# Patient Record
Sex: Female | Born: 1985
Health system: Southern US, Community
[De-identification: ages and names within clinical notes are randomized; demographics above are authoritative.]

## PROBLEM LIST (undated history)

## (undated) DIAGNOSIS — O24419 Gestational diabetes mellitus in pregnancy, unspecified control: Secondary | ICD-10-CM

## (undated) DIAGNOSIS — N390 Urinary tract infection, site not specified: Secondary | ICD-10-CM

## (undated) DIAGNOSIS — R519 Headache, unspecified: Secondary | ICD-10-CM

## (undated) DIAGNOSIS — Z789 Other specified health status: Secondary | ICD-10-CM

## (undated) HISTORY — DX: Other specified health status: Z78.9

## (undated) HISTORY — DX: Headache, unspecified: R51.9

---
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
## 2007-11-13 ENCOUNTER — Ambulatory Visit (HOSPITAL_COMMUNITY): Admission: RE | Admit: 2007-11-13 | Discharge: 2007-11-13 | Payer: Self-pay | Admitting: Obstetrics & Gynecology

## 2008-03-28 ENCOUNTER — Ambulatory Visit: Payer: Self-pay | Admitting: Obstetrics & Gynecology

## 2008-03-28 ENCOUNTER — Inpatient Hospital Stay (HOSPITAL_COMMUNITY): Admission: RE | Admit: 2008-03-28 | Discharge: 2008-03-30 | Payer: Self-pay | Admitting: Obstetrics & Gynecology

## 2010-07-02 LAB — CBC
Hemoglobin: 10.9 g/dL — ABNORMAL LOW (ref 12.0–15.0)
Hemoglobin: 12.5 g/dL (ref 12.0–15.0)
MCV: 90.7 fL (ref 78.0–100.0)
RBC: 3.57 MIL/uL — ABNORMAL LOW (ref 3.87–5.11)
RBC: 4.24 MIL/uL (ref 3.87–5.11)
RDW: 14.2 % (ref 11.5–15.5)
WBC: 8.5 10*3/uL (ref 4.0–10.5)
WBC: 8.8 10*3/uL (ref 4.0–10.5)

## 2010-07-31 NOTE — Op Note (Signed)
NAMEAPOORVA, BUGAY       ACCOUNT NO.:  [id number]   MEDICAL RECORD NO.:  [id number]          PATIENT TYPE:  INP   LOCATION:  9142                          FACILITY:  [facility]   PHYSICIAN:  Norton Blizzard, MD    DATE OF BIRTH:  1985-04-27   DATE OF PROCEDURE:  DATE OF DISCHARGE:                               OPERATIVE REPORT   PREOPERATIVE DIAGNOSES:  1. Intrauterine pregnancy at term.  2. History of previous cesarean section.   POSTOPERATIVE DIAGNOSES:  1. Intrauterine pregnancy at term.  2. History of previous cesarean section.   PROCEDURE:  Repeat low transverse cesarean section.   SURGEON:  Norton Blizzard, MD   ASSISTANT:  Odie Sera, DO   ANESTHESIA:  Spinal and local.   INDICATIONS FOR PROCEDURE:  Ms. Kinzlee Selvy is a gravida 2,  now para 2-0-0-2, who presents at 39-3/7th weeks for elective repeat low  transverse cesarean section.  She has a history of low transverse  cesarean section for failure to progress and was previously counseled on  vaginal birth after cesarean section versus elective cesarean, the  patient elected for an elective cesarean.  The risks and benefits of  this procedure to include but not limited to bleeding, infection, and  damage to internal organs, need for additional procedures including  hysterectomy in the event of a life-threatening hemorrhage were  discussed with the patient who voiced understanding and desired to  proceed with a cesarean section.   FINDINGS:  1. Clear amniotic fluid.  2. Viable female infant with Apgars 9 and 9.  3. Bilateral ovaries and oviducts grossly normal.   SPECIMENS:  Placenta.   DISPOSITION OF SPECIMEN:  Labor and Delivery.   ESTIMATED BLOOD LOSS:  600 mL.   COMPLICATIONS:  None immediate.   DESCRIPTION OF PROCEDURE:  The patient was taken to the operating room  where spinal anesthesia was introduced.  The patient was then prepped  and draped in the usual sterile manner.  A time-out  was conducted.  The  patient was placed in the left dorsal supine position.  A Pfannenstiel  incision was made in the skin with the scalpel.  The incision was  extended down to the fascial layers, and the fascia was incised in the  midline using the scalpel.  The fascial incision was extended  bilaterally using Mayo scissors in the usual manner.  The fascia was  then dissected off the underlying rectus muscles bluntly and sharply.  The rectus muscles were then separated in the midline, and the  peritoneum was opened bluntly with finger.  Peritoneal opening was  spread, and the peritoneal opening was extended with electrocautery.  The rectus muscles needed to be separated further with sharp dissection  with Mayo scissors in the inferior aspect of the rectus muscles.  Appropriate opening to the uterus was obtained, and a bladder blade was  placed.  A transverse incision was made in the lower uterine segment  with the scalpel.  The incision was carried carefully through the  myometrial layers.  The last myometrial layers were penetrated with a  hemostat.  Clear amniotic fluid was noted.  The  uterine opening was  extended bilaterally with manual traction.  The baby was noted to be in  a vertex occiput posterior presentation.  The head was grasped and  elevated out of the pelvis.  The bladder blade was removed.  The head  was delivered with the assistance of fundal pressure.  The mouth and  nares were then bulb suctioned.  The shoulders then followed by rest of  the corpus were delivered without problem.  The mouth and nares were  suctioned again, and the cord was clamped and cut, and the newborn was  transferred to the awaiting NICU staff.  The placenta was then removed  with the assistance of fundal massage.  Placenta was intact and had a  three-vessel cord.  Cord blood was collected.  The uterus was cleared of  clots and debris using a dry sponge.  The uterine incision was then  closed  using 0 Monocryl in the usual fashion, the first layer was closed  using a running interlocking suture.  A second layer closure was done  with the same 0 Vicryl suture in an imbricating fashion.  Good  hemostasis was noted.  The bilateral ovaries and oviducts were grossly  normal.  The rectus muscles were loosely approximated using 0 chromic.  The fascia was then closed using 0 PDS suture in a running  noninterlocking fashion.  Good hemostasis was noted.  There were no  defects noted in the fascia.  The subcutaneous tissues were irrigated  with a wet lap sponge.  Good hemostasis was noted.  The skin was then  closed with staples in the usual manner.  The superior and inferior  aspects of the incision were injected with 10 mL of 0.25% Marcaine each.  A pressure dressing was applied.  The patient was taken to the PACU in  good condition.      Odie Sera, DO  Electronically Signed     ______________________________  Norton Blizzard, MD    MC/MEDQ  D:  03/28/2008  T:  03/29/2008  Job:  [id number]

## 2010-07-31 NOTE — Discharge Summary (Signed)
NAMEIANNA, SALMELA       ACCOUNT NO.:  [id number]   MEDICAL RECORD NO.:  [id number]          PATIENT TYPE:  INP   LOCATION:  9142                          [facility]  [facility]   PHYSICIAN:  Norton Blizzard, MD    DATE OF BIRTH:  13-Apr-1985   DATE OF ADMISSION:  03/28/2008  DATE OF DISCHARGE:  03/30/2008                               DISCHARGE SUMMARY   BRIEF HOSPITAL COURSE:  Ms. Cristine Polio is a 25 year old G2, P2-0-0-  2 who presented to hospital at 21 and 3 weeks' gestation for scheduled  elective repeat C-section.  She was taken to the operating room, where a  low transverse caesarean section was performed by a Pfannenstiel  incision.  Her postoperative recovery course was normal without  complications.  Her hemoglobin was stable.  She is breastfeeding and  augmenting with some formula.  Her pain is well controlled.  She was  able to ambulate, eat, void, and have flatus prior to discharge.  She is  requesting pills for birth control.  She has been discharged on  progesterone only pills with instructions to address during her followup  visits for the possible transition to combined contraceptive pills.   LABORATORY DATA DURING HOSPITALIZATION:  Hemoglobin on admission 12.5,  on discharge 10.9.   DISCHARGE CONDITION:  Good.   DISPOSITION:  Home.  Follow up in 6 weeks at [facility].   DISCHARGE MEDICATIONS:  1. Prenatal vitamin.  2. Micronor 0.35 mg 1 by mouth every 24 hours.  3. Percocet 5/325 1-2 by mouth every 46 hours as needed for pain #30      Loel Dubonnet, MD      Norton Blizzard, MD  Electronically Signed    PN/MEDQ  D:  03/30/2008  T:  03/30/2008  Job:  [id number]   cc:   Norton Blizzard, MD   [facility] Department

## 2010-08-03 NOTE — Discharge Summary (Signed)
   NAME:  Natalie Snow, Natalie Snow                   ACCOUNT NO.:  [id number]   MEDICAL RECORD NO.:  [id number]                   PATIENT TYPE:  INP   LOCATION:  9147                                 [facility]   PHYSICIAN:  Tanya S. Shawnie Pons, M.D.                DATE OF BIRTH:  07/05/1985   DATE OF ADMISSION:  11/18/2002  DATE OF DISCHARGE:  11/22/2002                                 DISCHARGE SUMMARY   CONSULTATIONS:  None.   PROCEDURES:  Low transverse C-section.   DISCHARGE DIAGNOSES:  1. Intrauterine pregnancy at 41-3/[redacted] weeks gestation.  2. Postdates pregnancy.  3. Status post low transverse cesarean section secondary to failure to     progress and face presentation.  4. Face presentation.  5. Delivery of a viable female infant.  6. Polyhydramnios.   DISCHARGE MEDICATIONS:  1. Percocet 5/325 one to two p.o. q.4h. p.r.n. strong pain.  2. Ibuprofen 600 mg one p.o. q.6h. p.r.n. mild pain.  3. Iron 325 mg one p.o. b.i.d. for the next 6 weeks.  4. Prenatal vitamins one tablet p.o. once daily while breast-feeding.   FOLLOW UP:  The patient should follow up with [facility] in 6 weeks for  postpartum evaluation.   HOSPITAL COURSE:  [name] is a 25 year old G1, P0, presenting  at 41-3/7 weeks for induction of labor for postdates.  The patient was  admitted for cervical ripening as well as labor augmentation.  The patient  received amnioinfusion for light meconium.  The patient was appreciated to  have a face presentation, was also appreciated to have failure to progress.  The patient underwent low transverse C-section secondary to face  presentation as well as failure to progress.  Delivery of a viable female  infant with Apgars 9 at one minute and 9 at five minutes and infant weighed  8 pounds 2 ounces.  Again, presentation was vertex face presentation with  thick meconium-stained fluid.  Arterial pH of 7.29.  Please see operative  report for further details.  The  patient well in postoperative period,  remained afebrile and tolerating p.o. without difficulty.  The patient was  discharged on postoperative day #3 in stable condition.  The patient is  breast-feeding and will use condoms for contraception.   DISCHARGE INSTRUCTIONS:  Wound care: No heavy lifting for the next 6 weeks.  Diet: Regular.  Activity: No heavy lifting over 10 pounds.  Social activity:  Nothing per vagina for the next 6 weeks.   SPECIAL INSTRUCTIONS:  The patient should return to maternity admissions for  development of fever, severe abdominal pain, vomiting.    Nilda Simmer, M.D.                        Shelbie Proctor. Shawnie Pons, M.D.   KS/MEDQ  D:  11/22/2002  T:  11/22/2002  Job:  [id number]

## 2010-08-03 NOTE — Op Note (Signed)
NAME:  Natalie Snow, Natalie Snow                   ACCOUNT NO.:  [id number]   MEDICAL RECORD NO.:  [id number]                   PATIENT TYPE:  INP   LOCATION:  9147                                 [facility]   PHYSICIAN:  Elsie Lincoln                       DATE OF BIRTH:  [birth year]   DATE OF PROCEDURE:  11/19/2002  DATE OF DISCHARGE:                                 OPERATIVE REPORT   PREOPERATIVE DIAGNOSES:  1. A 25 year old para 0 female at 81 weeks' and 3 days estimated gestation.  2. Face presentation.  3. Failure to progress.   POSTOPERATIVE DIAGNOSES:  1. A 25 year old para 0 female at 55 weeks' and 3 days estimated gestation.  2. Face presentation.  3. Failure to progress.   PROCEDURE:  Primary low flap transverse cesarean section.   SURGEON:  Dr. Elsie Lincoln.   ASSISTANT:  Phil D. Okey Dupre, M.D.   ANESTHESIA:  Spinal.   ESTIMATED BLOOD LOSS:  800 mL.   COMPLICATIONS:  None.   PATHOLOGY:  None.   DRAINS:  Foley.   FINDINGS:  A viable female infant.  Apgars 9 one, 9 five, 8 pounds 2 ounces.  Vertex face presentation, thick meconium-stained fluid.  Arterial pH 8.29.  Normal tubes, ovaries, and uterus, and ____________ delivery.   PROCEDURE:  After informed consent obtained, the patient was taken to the  operating room where her spinal anesthesia was found to be adequate.  The  patient was placed in the dorsal supine position, prepped and draped in  normal sterile fashion, and a Foley placed in the bladder.  A Pfannenstiel  skin incision was made with a scalpel and carried down to the underlying  layer of fascia with the Bovie.  The Bovie was used to incise the fascia in  the midline.  This was extended bilaterally with the Mayo scissors.  The  superior and inferior aspects of the fascial incision were grasped with  Kocher clamps, tented up, and dissected off with the Bovie.  The peritoneum  was identified, tented up, and entered sharply with Metzenbaum  scissors.  This incision was extended superiorly and inferiorly with good visualization  of the bladder.  The bladder blade was inserted.  The vesicouterine  peritoneum was identified, tented up, and entered sharply with Metzenbaum  scissors.  This incision was extended bilaterally.  The bladder flap was  created digitally and the bladder blade was reinserted.  The uterine  incision was made in the lower uterine segment in transverse fashion with  the scalpel.  This incision was extended bilaterally with the bandage  scissors.  The baby's head delivered without incident.  The nose and mouth  were suctioned with the DeLee suction secondary to the meconium.  The rest  of the baby's body delivered without incident.  The cord was clamped and cut  and the baby was handed off to the awaiting pediatricians.  Cord  blood was  sent for type and screening.  __________ was none.  The placenta delivered  spontaneously with three-vessel cord.  The uterus was cleared of all clots  and debris and the uterine incision was closed with 0 Vicryl in a running-  locked fashion.  Excellent hemostasis was noted.  The gutters were cleared  of all clots and debris and the adnexa were noted to be grossly normal.  The  uterine incision was re-examined and noted to be hemostatic one last time.  The fascia was closed with 0 Vicryl in a running fashion.  Good hemostasis  was noted.  The subcutaneous tissue was copiously irrigated and noted to be  hemostatic.  The skin was closed with staples.  The patient tolerated the  procedure well.  Sponge, instrument, lap, and needle counts correct x2.  The  patient went to the recovery room with a Foley in her bladder and a sterile  dressing placed on top of the abdomen.                                               Elsie Lincoln    KL/MEDQ  D:  11/19/2002  T:  11/20/2002  Job:  [id number]

## 2012-08-23 ENCOUNTER — Encounter (HOSPITAL_COMMUNITY): Payer: Self-pay | Admitting: Emergency Medicine

## 2012-08-23 ENCOUNTER — Emergency Department (HOSPITAL_COMMUNITY)
Admission: EM | Admit: 2012-08-23 | Discharge: 2012-08-23 | Disposition: A | Discharge status: Home / Self Care | Payer: Self-pay | Attending: Emergency Medicine | Admitting: Emergency Medicine

## 2012-08-23 DIAGNOSIS — N390 Urinary tract infection, site not specified: Secondary | ICD-10-CM | POA: Insufficient documentation

## 2012-08-23 DIAGNOSIS — Z3202 Encounter for pregnancy test, result negative: Secondary | ICD-10-CM | POA: Insufficient documentation

## 2012-08-23 HISTORY — DX: Urinary tract infection, site not specified: N39.0

## 2012-08-23 LAB — URINALYSIS, ROUTINE W REFLEX MICROSCOPIC
Glucose, UA: NEGATIVE mg/dL
Ketones, ur: 15 mg/dL — AB
pH: 6 (ref 5.0–8.0)

## 2012-08-23 LAB — WET PREP, GENITAL
Clue Cells Wet Prep HPF POC: NONE SEEN
Yeast Wet Prep HPF POC: NONE SEEN

## 2012-08-23 LAB — URINE MICROSCOPIC-ADD ON

## 2012-08-23 MED ORDER — PHENAZOPYRIDINE HCL 200 MG PO TABS: 200.0000 mg | ORAL_TABLET | Freq: Three times a day (TID) | ORAL | Status: DC

## 2012-08-23 MED ORDER — CEPHALEXIN 500 MG PO CAPS: 500.0000 mg | ORAL_CAPSULE | Freq: Four times a day (QID) | ORAL | Status: DC

## 2012-08-23 MED ORDER — CEPHALEXIN 250 MG PO CAPS
500.0000 mg | ORAL_CAPSULE | Freq: Once | ORAL | Status: AC
  Administered 2012-08-23: 500 mg via ORAL
  Filled 2012-08-23: qty 2

## 2012-08-23 NOTE — ED Provider Notes (Signed)
Medical screening examination/treatment/procedure(s) were performed by non-physician practitioner and as supervising physician I was immediately available for consultation/collaboration.   Loreto Loescher, MD 08/23/12 1457 

## 2012-08-23 NOTE — ED Notes (Signed)
PA at bedside.

## 2012-08-23 NOTE — ED Notes (Signed)
Fever, lower back pain, bilateral foot pain, + dysuria. + abdominal pain for 1 week. Started on Bactrim for UTI on Friday.

## 2012-08-23 NOTE — ED Notes (Signed)
LMP: 07/25/12

## 2012-08-23 NOTE — ED Provider Notes (Signed)
History     CSN: [id number]  Arrival date & time 08/23/12  4098   First MD Initiated Contact with Patient 08/23/12 0932      No chief complaint on file.   (Consider location/radiation/quality/duration/timing/severity/associated sxs/prior treatment) HPI  27 year old female presents complaining of fever and dysuria. History was obtained through family member who was at bedside. patient reports for the past week she has had persistent burning urination, urinary frequency, and low abnormal pain. Pain is causing her to be nauseous and she vomited twice in total and also having some mild loose stools. For the past several days she has had a fever as high as 103. She was seen at a doctor's office 2 days ago and was diagnosed with having year tract infection. She was prescribed Bactrim and ibuprofen which he has been taking however her symptoms has not improved. She continues to endorse abdominal pain, and low back pain. Patient denies chest pain, shortness of breath, hematochezia, melena, vaginal discharge, or rash. Her last menstrual period was the 10th of last month. No prior history of STD.  No past medical history on file.  No past surgical history on file.  No family history on file.  History  Substance Use Topics  . Smoking status: Not on file  . Smokeless tobacco: Not on file  . Alcohol Use: Not on file    OB History   No data available      Review of Systems  All other systems reviewed and are negative.    Allergies  Review of patient's allergies indicates not on file.  Home Medications  No current outpatient prescriptions on file.  There were no vitals taken for this visit.  Physical Exam  Nursing note and vitals reviewed. Constitutional: She is oriented to person, place, and time. She appears well-developed and well-nourished. No distress.  HENT:  Head: Normocephalic and atraumatic.  Eyes: Conjunctivae are normal.  Neck: Normal range of motion. Neck supple.   Cardiovascular: Normal rate and regular rhythm.  Exam reveals no gallop and no friction rub.   No murmur heard. Pulmonary/Chest: Effort normal and breath sounds normal. No respiratory distress. She has no wheezes. She exhibits no tenderness.  Abdominal: Soft. Bowel sounds are normal. There is tenderness (Mild suprapubic tenderness on palpation without guarding or rebound tenderness.).  Genitourinary: Vagina normal and uterus normal. There is no rash or lesion on the right labia. There is no rash or lesion on the left labia. Cervix exhibits no motion tenderness and no discharge. Right adnexum displays no mass and no tenderness. Left adnexum displays no mass and no tenderness. No erythema, tenderness or bleeding around the vagina. No vaginal discharge found.  Chaperone present  No significant CVA tenderness. Tenderness to paralumbar region on percussion. No overlying skin changes.  Musculoskeletal: Normal range of motion.  Lymphadenopathy:       Right: No inguinal adenopathy present.       Left: No inguinal adenopathy present.  Neurological: She is alert and oriented to person, place, and time.    ED Course  Procedures (including critical care time)  9:57 AM Pt with c/o fever, dysuria and abd/low back pain.  Pt is febrile here however tachycardic.  Since pt able to tolerates PO, will give PO fluid instead of IVF.  Will perform pelvic exam.    11:16 AM Patient without CVA tenderness. She is afebrile here in ER. I do not suspect pyelonephritis at this time. Her UA is positive for urinary tract infection. Pelvic  exam was unremarkable. she is able to tolerates by mouth.  My plan is to send a urine culture, we'll give Keflex and to have patient continue with her Bactrim.  Labs Reviewed  WET PREP, GENITAL - Abnormal; Notable for the following:    WBC, Wet Prep HPF POC FEW (*)    All other components within normal limits  URINALYSIS, ROUTINE W REFLEX MICROSCOPIC - Abnormal; Notable for the  following:    APPearance CLOUDY (*)    Hgb urine dipstick SMALL (*)    Ketones, ur 15 (*)    Nitrite POSITIVE (*)    Leukocytes, UA MODERATE (*)    All other components within normal limits  URINE MICROSCOPIC-ADD ON - Abnormal; Notable for the following:    Squamous Epithelial / LPF FEW (*)    Bacteria, UA MANY (*)    All other components within normal limits  GC/CHLAMYDIA PROBE AMP  URINE CULTURE  POCT PREGNANCY, URINE   No results found.   1. UTI (lower urinary tract infection)       MDM  BP 110/70  Pulse 91  Temp(Src) 98.1 F (36.7 C) (Oral)  Resp 16  SpO2 99%         Fayrene Helper, PA-C 08/23/12 1144

## 2012-08-23 NOTE — ED Notes (Signed)
MD at bedside. 

## 2012-08-25 LAB — URINE CULTURE: Colony Count: >=100000

## 2012-08-26 NOTE — ED Notes (Signed)
Post ED Visit - Positive Culture Follow-up  Culture report reviewed by antimicrobial stewardship pharmacist: [x]  Wes Dulaney, Pharm.D., BCPS []  Celedonio Miyamoto, Pharm.D., BCPS []  Georgina Pillion, Pharm.D., BCPS []  [place], [place].D., BCPS, AAHIVP []  Estella Husk, Pharm.D., BCPS, AAHIVP  Positive urine  culture  no further patient follow-up is required at this time.  Larena Sox 08/26/2012, 4:18 PM

## 2014-08-29 ENCOUNTER — Other Ambulatory Visit (HOSPITAL_COMMUNITY)
Admission: RE | Admit: 2014-08-29 | Discharge: 2014-08-29 | Disposition: A | Discharge status: Home / Self Care | Payer: BLUE CROSS/BLUE SHIELD | Source: Ambulatory Visit | Attending: Gynecology | Admitting: Gynecology

## 2014-08-29 ENCOUNTER — Ambulatory Visit (INDEPENDENT_AMBULATORY_CARE_PROVIDER_SITE_OTHER): Payer: BLUE CROSS/BLUE SHIELD | Admitting: Gynecology

## 2014-08-29 ENCOUNTER — Encounter: Payer: Self-pay | Admitting: Gynecology

## 2014-08-29 VITALS — BP 110/70 | Ht 64.0 in | Wt 157.8 lb

## 2014-08-29 DIAGNOSIS — Z124 Encounter for screening for malignant neoplasm of cervix: Secondary | ICD-10-CM | POA: Diagnosis not present

## 2014-08-29 DIAGNOSIS — Z01419 Encounter for gynecological examination (general) (routine) without abnormal findings: Secondary | ICD-10-CM

## 2014-08-29 DIAGNOSIS — Z01411 Encounter for gynecological examination (general) (routine) with abnormal findings: Secondary | ICD-10-CM | POA: Insufficient documentation

## 2014-08-29 DIAGNOSIS — N915 Oligomenorrhea, unspecified: Secondary | ICD-10-CM | POA: Diagnosis not present

## 2014-08-29 DIAGNOSIS — Z1151 Encounter for screening for human papillomavirus (HPV): Secondary | ICD-10-CM | POA: Insufficient documentation

## 2014-08-29 NOTE — Patient Instructions (Signed)
Levonorgestrel intrauterine device (IUD) What is this medicine? LEVONORGESTREL IUD (LEE voe nor jes trel) is a contraceptive (birth control) device. The device is placed inside the uterus by a healthcare professional. It is used to prevent pregnancy and can also be used to treat heavy bleeding that occurs during your period. Depending on the device, it can be used for 3 to 5 years. This medicine may be used for other purposes; ask your health care provider or pharmacist if you have questions. COMMON BRAND NAME(S): LILETTA, Mirena, Skyla What should I tell my health care provider before I take this medicine? They need to know if you have any of these conditions: -abnormal Pap smear -cancer of the breast, uterus, or cervix -diabetes -endometritis -genital or pelvic infection now or in the past -have more than one sexual partner or your partner has more than one partner -heart disease -history of an ectopic or tubal pregnancy -immune system problems -IUD in place -liver disease or tumor -problems with blood clots or take blood-thinners -use intravenous drugs -uterus of unusual shape -vaginal bleeding that has not been explained -an unusual or allergic reaction to levonorgestrel, other hormones, silicone, or polyethylene, medicines, foods, dyes, or preservatives -pregnant or trying to get pregnant -breast-feeding How should I use this medicine? This device is placed inside the uterus by a health care professional. Talk to your pediatrician regarding the use of this medicine in children. Special care may be needed. Overdosage: If you think you have taken too much of this medicine contact a poison control center or emergency room at once. NOTE: This medicine is only for you. Do not share this medicine with others. What if I miss a dose? This does not apply. What may interact with this medicine? Do not take this medicine with any of the following  medications: -amprenavir -bosentan -fosamprenavir This medicine may also interact with the following medications: -aprepitant -barbiturate medicines for inducing sleep or treating seizures -bexarotene -griseofulvin -medicines to treat seizures like carbamazepine, ethotoin, felbamate, oxcarbazepine, phenytoin, topiramate -modafinil -pioglitazone -rifabutin -rifampin -rifapentine -some medicines to treat HIV infection like atazanavir, indinavir, lopinavir, nelfinavir, tipranavir, ritonavir -St. John's wort -warfarin This list may not describe all possible interactions. Give your health care provider a list of all the medicines, herbs, non-prescription drugs, or dietary supplements you use. Also tell them if you smoke, drink alcohol, or use illegal drugs. Some items may interact with your medicine. What should I watch for while using this medicine? Visit your doctor or health care professional for regular check ups. See your doctor if you or your partner has sexual contact with others, becomes HIV positive, or gets a sexual transmitted disease. This product does not protect you against HIV infection (AIDS) or other sexually transmitted diseases. You can check the placement of the IUD yourself by reaching up to the top of your vagina with clean fingers to feel the threads. Do not pull on the threads. It is a good habit to check placement after each menstrual period. Call your doctor right away if you feel more of the IUD than just the threads or if you cannot feel the threads at all. The IUD may come out by itself. You may become pregnant if the device comes out. If you notice that the IUD has come out use a backup birth control method like condoms and call your health care provider. Using tampons will not change the position of the IUD and are okay to use during your period. What side effects may   I notice from receiving this medicine? Side effects that you should report to your doctor or  health care professional as soon as possible: -allergic reactions like skin rash, itching or hives, swelling of the face, lips, or tongue -fever, flu-like symptoms -genital sores -high blood pressure -no menstrual period for 6 weeks during use -pain, swelling, warmth in the leg -pelvic pain or tenderness -severe or sudden headache -signs of pregnancy -stomach cramping -sudden shortness of breath -trouble with balance, talking, or walking -unusual vaginal bleeding, discharge -yellowing of the eyes or skin Side effects that usually do not require medical attention (report to your doctor or health care professional if they continue or are bothersome): -acne -breast pain -change in sex drive or performance -changes in weight -cramping, dizziness, or faintness while the device is being inserted -headache -irregular menstrual bleeding within first 3 to 6 months of use -nausea This list may not describe all possible side effects. Call your doctor for medical advice about side effects. You may report side effects to FDA at 1-800-FDA-1088. Where should I keep my medicine? This does not apply. NOTE: This sheet is a summary. It may not cover all possible information. If you have questions about this medicine, talk to your doctor, pharmacist, or health care provider.  2015, [employer]vier/Gold Standard. (2011-04-04 13:54:04)  

## 2014-08-29 NOTE — Progress Notes (Signed)
Natalie Snow [facility] 06/27/1985 [id number]   History:    29 y.o.  for annual gyn exam who was referred to our practice as a courtesy of her primary care physician Dr.Giovanni Llebre as a result of patient's history of dysfunction uterine bleeding. There was question at one time whether she had a cervical polyp or not. Patient does not want to go on any oral contraception pill because of weight gain. She is not using any form of contraception. But would like to discuss options. She stated her last Pap smear was [facility] several years ago. Her PCP has drawn all her lab work to include thyroid function test. Patient denies any nipple discharge she does suffer time for migraine headaches. Patient's had normal Pap smears in the past. Her 2 deliveries were via cesarean section several years ago.  Past medical history,surgical history, family history and social history were all reviewed and documented in the EPIC chart.  Gynecologic History Patient's last menstrual period was 08/03/2014. Contraception: none Last Pap: Approximately 3 years ago. Results were: Patient reports it was normal Last mammogram: Not indicated. Results were: normal  Obstetric History OB History  Gravida Para Term Preterm AB SAB TAB Ectopic Multiple Living  2 2        2     # Outcome Date GA Lbr Len/2nd Weight Sex Delivery Anes PTL Lv  2 Para           1 Para                ROS: A ROS was performed and pertinent positives and negatives are included in the history.  GENERAL: No fevers or chills. HEENT: No change in vision, no earache, sore throat or sinus congestion. NECK: No pain or stiffness. CARDIOVASCULAR: No chest pain or pressure. No palpitations. PULMONARY: No shortness of breath, cough or wheeze. GASTROINTESTINAL: No abdominal pain, nausea, vomiting or diarrhea, melena or bright red blood per rectum. GENITOURINARY: No urinary frequency, urgency, hesitancy or dysuria. MUSCULOSKELETAL: No joint or  muscle pain, no back pain, no recent trauma. DERMATOLOGIC: No rash, no itching, no lesions. ENDOCRINE: No polyuria, polydipsia, no heat or cold intolerance. No recent change in weight. HEMATOLOGICAL: No anemia or easy bruising or bleeding. NEUROLOGIC: No headache, seizures, numbness, tingling or weakness. PSYCHIATRIC: No depression, no loss of interest in normal activity or change in sleep pattern.     Exam: chaperone present  BP 110/70 mmHg  Ht 5\' 4"  (1.626 m)  Wt 157 lb 12.8 oz (71.578 kg)  BMI 27.07 kg/m2  LMP 08/03/2014  Body mass index is 27.07 kg/(m^2).  General appearance : Well developed well nourished female. No acute distress HEENT: Eyes: no retinal hemorrhage or exudates,  Neck supple, trachea midline, no carotid bruits, no thyroidmegaly Lungs: Clear to auscultation, no rhonchi or wheezes, or rib retractions  Heart: Regular rate and rhythm, no murmurs or gallops Breast:Examined in sitting and supine position were symmetrical in appearance, no palpable masses or tenderness,  no skin retraction, no nipple inversion, no nipple discharge, no skin discoloration, no axillary or supraclavicular lymphadenopathy Abdomen: no palpable masses or tenderness, no rebound or guarding Extremities: no edema or skin discoloration or tenderness  Pelvic:  Bartholin, Urethra, Skene Glands: Within normal limits             Vagina: No gross lesions or discharge  Cervix: No gross lesions or discharge  Uterus  anteverted, normal size, shape and consistency, non-tender and mobile  Adnexa  Without masses or tenderness  Anus and perineum  normal   Rectovaginal  normal sphincter tone without palpated masses or tenderness             Hemoccult not indicated     Assessment/Plan:  30 y.o. female for annual exam patient with heavy menstrual cycles lasting 7-10 days. Sometimes her cycles are every 28 day sometimes 35-40 days apart. Patient does not want to go on oral contraception pill or regulation of her  cycle but she does need some form of contraception. We discussed the Mirena IUD for which she is interested and in which literature information was provided. She will return back with her next cycle and we will insert the Mirena IUD. We are going to check a prolactin because of her irregular menstrual cycle. She denied any visual disturbances or any nipple discharge. Her Pap smear with HPV screening was done today.   Ok Edwards MD, 2:26 PM 08/29/2014

## 2014-08-29 NOTE — Addendum Note (Signed)
Addended by: Richardson Chiquito on: 08/29/2014 03:39 PM   Modules accepted: Orders

## 2014-08-30 ENCOUNTER — Telehealth: Payer: Self-pay | Admitting: Gynecology

## 2014-08-30 LAB — PROLACTIN: Prolactin: 6.6 ng/mL

## 2014-08-30 NOTE — Telephone Encounter (Signed)
08/30/14-I LM VM that her BC ins will cover the Mirena & insertion for contraception at 100% and that it needs to be inserted while on cycle. Pt to call and confirm if she wants to proceed.wl

## 2014-08-31 LAB — CYTOLOGY - PAP

## 2015-08-26 ENCOUNTER — Ambulatory Visit (INDEPENDENT_AMBULATORY_CARE_PROVIDER_SITE_OTHER): Payer: BLUE CROSS/BLUE SHIELD | Admitting: Urgent Care

## 2015-08-26 ENCOUNTER — Ambulatory Visit (INDEPENDENT_AMBULATORY_CARE_PROVIDER_SITE_OTHER): Payer: BLUE CROSS/BLUE SHIELD

## 2015-08-26 VITALS — BP 122/80 | HR 77 | Temp 97.9°F | Resp 18 | Ht 64.0 in | Wt 158.5 lb

## 2015-08-26 DIAGNOSIS — J029 Acute pharyngitis, unspecified: Secondary | ICD-10-CM

## 2015-08-26 DIAGNOSIS — R0981 Nasal congestion: Secondary | ICD-10-CM | POA: Diagnosis not present

## 2015-08-26 DIAGNOSIS — R51 Headache: Secondary | ICD-10-CM | POA: Diagnosis not present

## 2015-08-26 DIAGNOSIS — H9203 Otalgia, bilateral: Secondary | ICD-10-CM

## 2015-08-26 DIAGNOSIS — R05 Cough: Secondary | ICD-10-CM

## 2015-08-26 DIAGNOSIS — R059 Cough, unspecified: Secondary | ICD-10-CM

## 2015-08-26 DIAGNOSIS — R519 Headache, unspecified: Secondary | ICD-10-CM

## 2015-08-26 MED ORDER — HYDROCODONE-HOMATROPINE 5-1.5 MG/5ML PO SYRP: 5.0000 mL | ORAL_SOLUTION | Freq: Three times a day (TID) | ORAL | Status: DC | PRN

## 2015-08-26 MED ORDER — PSEUDOEPHEDRINE HCL ER 120 MG PO TB12: 120.0000 mg | ORAL_TABLET | Freq: Two times a day (BID) | ORAL | Status: DC

## 2015-08-26 MED ORDER — BENZONATATE 100 MG PO CAPS: 100.0000 mg | ORAL_CAPSULE | Freq: Three times a day (TID) | ORAL | Status: DC | PRN

## 2015-08-26 MED ORDER — CETIRIZINE HCL 10 MG PO TABS: 10.0000 mg | ORAL_TABLET | Freq: Every day | ORAL | Status: DC

## 2015-08-26 NOTE — Progress Notes (Signed)
    MRN: [id number] DOB: [birth year]  Subjective:   Natalie Snow is a 30 y.o. female presenting for chief complaint of Sore Throat; Cough; and Headache  Reports 3 day history sore throat, productive cough, nasal congestion, sinus headache, bilateral ear pain. Cough elicits shob. Her throat pain has improved. Has tried Mucinex and VaporRub, Ibuprofen with minimal relief. Admits history of seasonal allergies, not currently taking anything for this. Denies chest pain, wheezing, n/v, abdominal pain. Denies smoking cigarettes or drinking alcohol.   Natalie Snow currently has no medications in their medication list. Also has No Known Allergies.  Natalie Snow  has a past medical history of UTI (urinary tract infection). Also  has past surgical history that includes Cesarean section.  Her family history is positive for hypothyroidism in her mother and sister.  Objective:   Vitals: BP 122/80 mmHg  Pulse 77  Temp(Src) 97.9 F (36.6 C) (Oral)  Resp 18  Ht 5\' 4"  (1.626 m)  Wt 158 lb 8 oz (71.895 kg)  BMI 27.19 kg/m2  SpO2 99%  LMP 07/28/2015 (Exact Date)  Physical Exam  Constitutional: She is oriented to person, place, and time. She appears well-developed and well-nourished.  HENT:  TM's intact bilaterally, no effusions or erythema. Nasal turbinates pink and moist, nasal passages patent. Frontal and mild bilateral maxillary sinus tenderness. Mild post-nasal drainage present but no exudates or erythema, mucous membranes moist, dentition in good repair.  Eyes: Right eye exhibits no discharge. Left eye exhibits no discharge. No scleral icterus.  Neck: Normal range of motion. Neck supple.  Cardiovascular: Normal rate, regular rhythm and intact distal pulses.  Exam reveals no gallop and no friction rub.   No murmur heard. Pulmonary/Chest: No respiratory distress. She has no wheezes. She has no rales.  Lymphadenopathy:    She has no cervical adenopathy.  Neurological: She is alert and oriented to  person, place, and time.  Skin: Skin is warm and dry.   Dg Sinuses Complete  08/26/2015  CLINICAL DATA:  Sinus congestion and facial pain for 3 days. EXAM: PARANASAL SINUSES - COMPLETE 3 + VIEW COMPARISON:  None. FINDINGS: No fluid levels in the paranasal sinuses. Mastoid air cells are clear. No osseous destruction. IMPRESSION: No plain film evidence of sinus disease. Please note that plain films are of low sensitivity and the test of choice is mini sinus CT. Electronically Signed   By: Natalie GreavesKyle  Snow M.D.   On: 08/26/2015 11:32   Assessment and Plan :   1. Cough 2. Sore throat 3. Sinus congestion 4. Sinus headache 5. Ear pain, bilateral - Likely undergoing viral upper respiratory infection. Recommended supportive care. RTC in 1 week if symptoms persists.   Natalie BambergMario Claud Gowan, PA-C [facility] [phone] 08/26/2015 10:28 AM

## 2015-08-26 NOTE — Patient Instructions (Addendum)
Upper Respiratory Infection, Adult Most upper respiratory infections (URIs) are a viral infection of the air passages leading to the lungs. A URI affects the nose, throat, and upper air passages. The most common type of URI is nasopharyngitis and is typically referred to as "the common cold." URIs run their course and usually go away on their own. Most of the time, a URI does not require medical attention, but sometimes a bacterial infection in the upper airways can follow a viral infection. This is called a secondary infection. Sinus and middle ear infections are common types of secondary upper respiratory infections. Bacterial pneumonia can also complicate a URI. A URI can worsen asthma and chronic obstructive pulmonary disease (COPD). Sometimes, these complications can require emergency medical care and may be life threatening.  CAUSES Almost all URIs are caused by viruses. A virus is a type of germ and can spread from one person to another.  RISKS FACTORS You may be at risk for a URI if:   You smoke.   You have chronic heart or lung disease.  You have a weakened defense (immune) system.   You are very young or very old.   You have nasal allergies or asthma.  You work in crowded or poorly ventilated areas.  You work in health care facilities or schools. SIGNS AND SYMPTOMS  Symptoms typically develop 2-3 days after you come in contact with a cold virus. Most viral URIs last 7-10 days. However, viral URIs from the influenza virus (flu virus) can last 14-18 days and are typically more severe. Symptoms may include:   Runny or stuffy (congested) nose.   Sneezing.   Cough.   Sore throat.   Headache.   Fatigue.   Fever.   Loss of appetite.   Pain in your forehead, behind your eyes, and over your cheekbones (sinus pain).  Muscle aches.  DIAGNOSIS  Your health care provider may diagnose a URI by:  Physical exam.  Tests to check that your symptoms are not due to  another condition such as:  Strep throat.  Sinusitis.  Pneumonia.  Asthma. TREATMENT  A URI goes away on its own with time. It cannot be cured with medicines, but medicines may be prescribed or recommended to relieve symptoms. Medicines may help:  Reduce your fever.  Reduce your cough.  Relieve nasal congestion. HOME CARE INSTRUCTIONS   Take medicines only as directed by your health care provider.   Gargle warm saltwater or take cough drops to comfort your throat as directed by your health care provider.  Use a warm mist humidifier or inhale steam from a shower to increase air moisture. This may make it easier to breathe.  Drink enough fluid to keep your urine clear or pale yellow.   Eat soups and other clear broths and maintain good nutrition.   Rest as needed.   Return to work when your temperature has returned to normal or as your health care provider advises. You may need to stay home longer to avoid infecting others. You can also use a face mask and careful hand washing to prevent spread of the virus.  Increase the usage of your inhaler if you have asthma.   Do not use any tobacco products, including cigarettes, chewing tobacco, or electronic cigarettes. If you need help quitting, ask your health care provider. PREVENTION  The best way to protect yourself from getting a cold is to practice good hygiene.   Avoid oral or hand contact with people with cold   symptoms.   Wash your hands often if contact occurs.  There is no clear evidence that vitamin C, vitamin E, echinacea, or exercise reduces the chance of developing a cold. However, it is always recommended to get plenty of rest, exercise, and practice good nutrition.  SEEK MEDICAL CARE IF:   You are getting worse rather than better.   Your symptoms are not controlled by medicine.   You have chills.  You have worsening shortness of breath.  You have brown or red mucus.  You have yellow or brown nasal  discharge.  You have pain in your face, especially when you bend forward.  You have a fever.  You have swollen neck glands.  You have pain while swallowing.  You have white areas in the back of your throat. SEEK IMMEDIATE MEDICAL CARE IF:   You have severe or persistent:  Headache.  Ear pain.  Sinus pain.  Chest pain.  You have chronic lung disease and any of the following:  Wheezing.  Prolonged cough.  Coughing up blood.  A change in your usual mucus.  You have a stiff neck.  You have changes in your:  Vision.  Hearing.  Thinking.  Mood. MAKE SURE YOU:   Understand these instructions.  Will watch your condition.  Will get help right away if you are not doing well or get worse.   This information is not intended to replace advice given to you by your health care provider. Make sure you discuss any questions you have with your health care provider.   Document Released: 08/28/2000 Document Revised: 07/19/2014 Document Reviewed: 06/09/2013 Elsevier Interactive Patient Education 2016 Elsevier Inc.     IF you received an x-ray today, you will receive an invoice from [facility]. Please contact [facility] at [phone] with questions or concerns regarding your invoice.   IF you received labwork today, you will receive an invoice from [facility]. Please contact [facility] at [phone] with questions or concerns regarding your invoice.   Our billing staff will not be able to assist you with questions regarding bills from these companies.  You will be contacted with the lab results as soon as they are available. The fastest way to get your results is to activate your My Chart account. Instructions are located on the last page of this paperwork. If you have not heard from us regarding the results in 2 weeks, please contact this office.     

## 2015-08-30 ENCOUNTER — Encounter: Payer: Self-pay | Admitting: *Deleted

## 2015-11-07 ENCOUNTER — Encounter: Payer: Self-pay | Admitting: Obstetrics and Gynecology

## 2015-12-20 ENCOUNTER — Telehealth: Payer: Self-pay | Admitting: *Deleted

## 2015-12-20 ENCOUNTER — Encounter: Payer: BLUE CROSS/BLUE SHIELD | Admitting: Obstetrics and Gynecology

## 2015-12-20 ENCOUNTER — Encounter: Payer: Self-pay | Admitting: *Deleted

## 2015-12-20 NOTE — Telephone Encounter (Signed)
Natalie Snow missed a scheduled appointment for a colposcopy. I called and left her a message she missed a scheduled appointment. Please call back to reschedule. Will send letter.

## 2016-07-31 ENCOUNTER — Encounter: Payer: Self-pay | Admitting: Gynecology

## 2016-09-23 DIAGNOSIS — J069 Acute upper respiratory infection, unspecified: Secondary | ICD-10-CM | POA: Diagnosis not present

## 2016-11-25 DIAGNOSIS — R109 Unspecified abdominal pain: Secondary | ICD-10-CM | POA: Diagnosis not present

## 2016-11-25 DIAGNOSIS — R110 Nausea: Secondary | ICD-10-CM | POA: Diagnosis not present

## 2016-11-26 DIAGNOSIS — N859 Noninflammatory disorder of uterus, unspecified: Secondary | ICD-10-CM | POA: Diagnosis not present

## 2016-11-26 DIAGNOSIS — N200 Calculus of kidney: Secondary | ICD-10-CM | POA: Diagnosis not present

## 2016-12-31 DIAGNOSIS — Z23 Encounter for immunization: Secondary | ICD-10-CM | POA: Diagnosis not present

## 2017-02-14 DIAGNOSIS — Z30011 Encounter for initial prescription of contraceptive pills: Secondary | ICD-10-CM | POA: Diagnosis not present

## 2017-04-14 DIAGNOSIS — M545 Low back pain: Secondary | ICD-10-CM | POA: Diagnosis not present

## 2017-04-14 DIAGNOSIS — R109 Unspecified abdominal pain: Secondary | ICD-10-CM | POA: Diagnosis not present

## 2017-04-14 DIAGNOSIS — M79669 Pain in unspecified lower leg: Secondary | ICD-10-CM | POA: Diagnosis not present

## 2017-04-14 DIAGNOSIS — R102 Pelvic and perineal pain: Secondary | ICD-10-CM | POA: Diagnosis not present

## 2017-10-14 ENCOUNTER — Ambulatory Visit: Payer: BLUE CROSS/BLUE SHIELD | Admitting: Urgent Care

## 2017-10-14 ENCOUNTER — Encounter: Payer: Self-pay | Admitting: Urgent Care

## 2017-10-14 ENCOUNTER — Other Ambulatory Visit: Payer: Self-pay

## 2017-10-14 VITALS — BP 114/69 | HR 77 | Temp 98.3°F | Resp 16 | Ht 64.0 in | Wt 164.0 lb

## 2017-10-14 DIAGNOSIS — S39012A Strain of muscle, fascia and tendon of lower back, initial encounter: Secondary | ICD-10-CM | POA: Diagnosis not present

## 2017-10-14 DIAGNOSIS — S300XXA Contusion of lower back and pelvis, initial encounter: Secondary | ICD-10-CM

## 2017-10-14 DIAGNOSIS — M545 Low back pain: Secondary | ICD-10-CM

## 2017-10-14 LAB — POCT URINALYSIS DIP (MANUAL ENTRY)
BILIRUBIN UA: NEGATIVE
GLUCOSE UA: NEGATIVE mg/dL
Ketones, POC UA: NEGATIVE mg/dL
Leukocytes, UA: NEGATIVE
NITRITE UA: NEGATIVE
Protein Ur, POC: NEGATIVE mg/dL
SPEC GRAV UA: 1.02 (ref 1.010–1.025)
UROBILINOGEN UA: 0.2 U/dL
pH, UA: 7 (ref 5.0–8.0)

## 2017-10-14 MED ORDER — CYCLOBENZAPRINE HCL 5 MG PO TABS: 5.0000 mg | ORAL_TABLET | Freq: Two times a day (BID) | ORAL | 1 refills | Status: DC | PRN

## 2017-10-14 MED ORDER — MELOXICAM 7.5 MG PO TABS: 7.5000 mg | ORAL_TABLET | Freq: Every day | ORAL | 1 refills | Status: DC

## 2017-10-14 NOTE — Progress Notes (Signed)
    MRN: [id number] DOB: 06-28-1985  Subjective:   Natalie Snow is a 32 y.o. female presenting for 1 week history of persistent low back pain.  Reports that she is already been seen for this and had imaging done which was negative for arthritis but was prescribed medication for back strain and muscle spasms.  Today, she reports that she was at work and lifted something heavy in her back pain started again.  Denies fever, nausea, vomiting, hematuria, dysuria, weakness, numbness, tingling.  Natalie Snow is not currently taking medications.  Also has No Known Allergies.  Natalie Snow  has a past medical history of UTI (urinary tract infection). Also  has a past surgical history that includes Cesarean section.  Objective:   Vitals: BP 114/69   Pulse 77   Temp 98.3 F (36.8 C) (Oral)   Resp 16   Ht 5\' 4"  (1.626 m)   Wt 164 lb (74.4 kg)   LMP 10/14/2017   SpO2 98%   BMI 28.15 kg/m   Physical Exam  Constitutional: She is oriented to person, place, and time. She appears well-developed and well-nourished.  Cardiovascular: Normal rate.  Pulmonary/Chest: Effort normal.  Musculoskeletal:       Lumbar back: She exhibits decreased range of motion (full flexion and extension), tenderness (over area depicted) and spasm (over paraspinal muscles). She exhibits no bony tenderness, no swelling, no edema and no deformity.       Back:  Neurological: She is alert and oriented to person, place, and time.   Results for orders placed or performed in visit on 10/14/17 (from the past 24 hour(s))  POCT urinalysis dipstick     Status: Abnormal   Collection Time: 10/14/17  5:57 PM  Result Value Ref Range   Color, UA yellow yellow   Clarity, UA clear clear   Glucose, UA negative negative mg/dL   Bilirubin, UA negative negative   Ketones, POC UA negative negative mg/dL   Spec Grav, UA 4.0981.020 1.1911.010 - 1.025   Blood, UA small (A) negative   pH, UA 7.0 5.0 - 8.0   Protein Ur, POC negative negative mg/dL   Urobilinogen, UA 0.2 0.2 or 1.0 E.U./dL   Nitrite, UA Negative Negative   Leukocytes, UA Negative Negative   Assessment and Plan :   Low back strain, initial encounter  Low back pain, unspecified back pain laterality, unspecified chronicity, with sciatica presence unspecified - Plan: POCT urinalysis dipstick  Contusion of lower back, initial encounter  Will start conservative management with Flexeril and meloxicam.  Work note provided.  Counseled patient that we may need to use a steroid course of her symptoms persist.  Return to clinic precautions reviewed otherwise follow-up in 1 week.  Wallis BambergMario Zailynn Brandel, PA-C [facility] [phone] 10/14/2017  5:52 PM

## 2017-10-14 NOTE — Patient Instructions (Addendum)
Low Back Strain A strain is a stretch or tear in a muscle or the strong cords of tissue that attach muscle to bone (tendons). Strains of the lower back (lumbar spine) are a common cause of low back pain. A strain occurs when muscles or tendons are torn or are stretched beyond their limits. The muscles may become inflamed, resulting in pain and sudden muscle tightening (spasms). A strain can happen suddenly due to an injury (trauma), or it can develop gradually due to overuse. There are three types of strains:  Grade 1 is a mild strain involving a minor tear of the muscle fibers or tendons. This may cause some pain but no loss of muscle strength.  Grade 2 is a moderate strain involving a partial tear of the muscle fibers or tendons. This causes more severe pain and some loss of muscle strength.  Grade 3 is a severe strain involving a complete tear of the muscle or tendon. This causes severe pain and complete or nearly complete loss of muscle strength. What are the causes? This condition may be caused by:  Trauma, such as a fall or a hit to the body.  Twisting or overstretching the back. This may result from doing activities that require a lot of energy, such as lifting heavy objects. What increases the risk? The following factors may increase your risk of getting this condition:  Playing contact sports.  Participating in sports or activities that put excessive stress on the back and require a lot of bending and twisting, including:  Lifting weights or heavy objects.  Gymnastics.  Soccer.  Figure skating.  Snowboarding.  Being overweight or obese.  Having poor strength and flexibility. What are the signs or symptoms? Symptoms of this condition may include:  Sharp or dull pain in the lower back that does not go away. Pain may extend to the buttocks.  Stiffness.  Limited range of motion.  Inability to stand up straight due to stiffness or pain.  Muscle spasms. How is this  diagnosed?   This condition may be diagnosed based on:  Your symptoms.  Your medical history.  A physical exam.  Your health care provider may push on certain areas of your back to determine the source of your pain.  You may be asked to bend forward, backward, and side to side to assess the severity of your pain and your range of motion.  Imaging tests, such as:  X-rays.  MRI. How is this treated? Treatment for this condition may include:  Applying heat and cold to the affected area.  Medicines to help relieve pain and to relax your muscles (muscle relaxants).  NSAIDs to help reduce swelling and discomfort.  Physical therapy. When your symptoms improve, it is important to gradually return to your normal routine as soon as possible to reduce pain, avoid stiffness, and avoid loss of muscle strength. Generally, symptoms should improve within 6 weeks of treatment. However, recovery time varies. Follow these instructions at home: Managing pain, stiffness, and swelling  If directed, apply ice to the injured area during the first 24 hours after your injury.  Put ice in a plastic bag.  Place a towel between your skin and the bag.  Leave the ice on for 20 minutes, 2-3 times a day.  If directed, apply heat to the affected area as often as told by your health care provider. Use the heat source that your health care provider recommends, such as a moist heat pack or a heating pad.    or a heating pad. ? Place a towel between your skin and the heat source. ? Leave the heat on for 20-30 minutes. ? Remove the heat if your skin turns bright red. This is especially important if you are unable to feel pain, heat, or cold. You may have a greater risk of getting burned. Activity  Rest and return to your normal activities as told by your health care provider. Ask your health care provider what activities are safe for you.  Avoid activities that take a lot of effort (are strenuous) for as long as told  by your health care provider.  Do exercises as told by your health care provider. General instructions   Take over-the-counter and prescription medicines only as told by your health care provider.  If you have questions or concerns about safety while taking pain medicine, talk with your health care provider.  Do not drive or operate heavy machinery until you know how your pain medicine affects you.  Do not use any tobacco products, such as cigarettes, chewing tobacco, and e-cigarettes. Tobacco can delay bone healing. If you need help quitting, ask your health care provider.  Keep all follow-up visits as told by your health care provider. This is important. How is this prevented?  Warm up and stretch before being active.  Cool down and stretch after being active.  Give your body time to rest between periods of activity.  Avoid: ? Being physically inactive for long periods at a time. ? Exercising or playing sports when you are tired or in pain.  Use correct form when playing sports and lifting heavy objects.  Use good posture when sitting and standing.  Maintain a healthy weight.  Sleep on a mattress with medium firmness to support your back.  Make sure to use equipment that fits you, including shoes that fit well.  Be safe and responsible while being active to avoid falls.  Do at least 150 minutes of moderate-intensity exercise each week, such as brisk walking or water aerobics. Try a form of exercise that takes stress off your back, such as swimming or stationary cycling.  Maintain physical fitness, including: ? Strength. ? Flexibility. ? Cardiovascular fitness. ? Endurance. Contact a health care provider if:  Your back pain does not improve after 6 weeks of treatment.  Your symptoms get worse. Get help right away if:  Your back pain is severe.  You are unable to stand or walk.  You develop pain in your legs.  You develop weakness in your buttocks or  legs.  You have difficulty controlling when you urinate or when you have a bowel movement. This information is not intended to replace advice given to you by your health care provider. Make sure you discuss any questions you have with your health care provider. Document Released: 03/04/2005 Document Revised: 11/09/2015 Document Reviewed: 12/14/2014 Elsevier Interactive Patient Education  2017 Elsevier Inc.     IF you received an x-ray today, you will receive an invoice from [facility]. Please contact [facility] at [phone] with questions or concerns regarding your invoice.   IF you received labwork today, you will receive an invoice from [facility]. Please contact [facility] at [phone] with questions or concerns regarding your invoice.   Our billing staff will not be able to assist you with questions regarding bills from these companies.  You will be contacted with the lab results as soon as they are available. The fastest way to get your results is to activate your My   Chart account. Instructions are located on the last page of this paperwork. If you have not heard from [place] regarding the results in 2 weeks, please contact this office.

## 2017-10-16 ENCOUNTER — Encounter: Payer: Self-pay | Admitting: Urgent Care

## 2017-10-21 ENCOUNTER — Ambulatory Visit: Payer: BLUE CROSS/BLUE SHIELD | Admitting: Urgent Care

## 2017-10-25 ENCOUNTER — Ambulatory Visit: Payer: BLUE CROSS/BLUE SHIELD | Admitting: Urgent Care

## 2017-10-25 ENCOUNTER — Other Ambulatory Visit: Payer: Self-pay

## 2017-10-25 ENCOUNTER — Encounter: Payer: Self-pay | Admitting: Urgent Care

## 2017-10-25 VITALS — BP 124/75 | HR 76 | Temp 98.6°F | Resp 16 | Ht 64.0 in | Wt 162.4 lb

## 2017-10-25 DIAGNOSIS — S300XXD Contusion of lower back and pelvis, subsequent encounter: Secondary | ICD-10-CM | POA: Diagnosis not present

## 2017-10-25 DIAGNOSIS — S39012D Strain of muscle, fascia and tendon of lower back, subsequent encounter: Secondary | ICD-10-CM | POA: Diagnosis not present

## 2017-10-25 DIAGNOSIS — M545 Low back pain: Secondary | ICD-10-CM | POA: Diagnosis not present

## 2017-10-25 MED ORDER — METHOCARBAMOL 500 MG PO TABS: 500.0000 mg | ORAL_TABLET | Freq: Every evening | ORAL | 1 refills | Status: DC | PRN

## 2017-10-25 MED ORDER — PREDNISONE 20 MG PO TABS: ORAL_TABLET | ORAL | 0 refills | Status: DC

## 2017-10-25 NOTE — Patient Instructions (Addendum)
Ciática  Sciatica  La ciática es el dolor, entumecimiento, debilidad u hormigueo a lo largo del nervio ciático. El nervio ciático comienza en la parte inferior de la espalda y desciende por la parte posterior de cada pierna. Controla los músculos en la parte inferior de las piernas y en la parte posterior de las rodillas. También proporciona sensibilidad a la parte posterior de los muslos, la parte inferior de las piernas y la planta de los pies. La ciática es un síntoma de otra afección que ejerce presión o “pellizca” el nervio ciático.  Generalmente la ciática afecta solo a un lado del cuerpo. Suele desaparecer por sí sola o con tratamiento. En algunos casos, la ciática puede volver a aparecer (ser recurrente).  ¿Cuáles son las causas?  Esta afección causa presión sobre el nervio ciático o lo “pellizca”. Esto puede ser el resultado de:  · Un disco que sobresale demasiado (hernia de disco) entre los huesos de la columna vertebral (vértebras).  · Cambios relacionados con la edad en los discos de la columna vertebral (discopatía degenerativa).  · Un trastorno doloroso que afecta un músculo de las nalgas (síndrome piriforme).  · Un crecimiento óseo adicional (espolón óseo) cerca del nervio ciático.  · Una lesión o fractura de la pelvis.  · Embarazo.  · Tumor (poco frecuente).    ¿Qué incrementa el riesgo?  Los siguientes factores pueden hacer que usted sea más propenso a tener esta enfermedad:  · Practicar deportes en los que se ejerce presión sobre la columna vertebral o en los que la columna realiza mucho esfuerzo, como el fútbol americano o el levantamiento de pesas.  · Tener poca fuerza y flexibilidad.  · Antecedentes médicos de lesiones en la espalda.  · Antecedentes médicos de cirugía en la espalda.  · Estar sentado durante largos períodos de tiempo.  · Realizar actividades que requieren agacharse o levantar objetos en forma repetida.  · Obesidad.    ¿Cuáles son los signos o los síntomas?  Los síntomas pueden  ser leves o graves, y pueden incluir los siguientes:  · Cualquiera de los siguientes problemas en la parte inferior de la espalda, piernas, cadera o nalgas:  ? Hormigueo leve o dolor sordo.  ? Sensación de ardor.  ? Dolor agudo.  · Adormecimiento de la parte posterior de la pantorrilla o la planta del pie.  · Debilidad en las piernas.  · Dolor de espalda intenso que dificulta el movimiento.    Estos síntomas podrían empeorar al toser, estornudar o reírse, o cuando se está sentado o de pie durante períodos prolongados. El sobrepeso también puede empeorar los síntomas. En algunos casos, los síntomas regresan luego de un tiempo.  ¿Cómo se diagnostica?  Esta afección se puede diagnosticar en función de lo siguiente:  · Sus síntomas.  · Un examen físico. El médico podría indicarle que realice ciertos movimientos para controlar si estos desencadenan los síntomas.  · También pueden hacerle exámenes que incluyen lo siguiente:  ? Análisis de sangre.  ? Radiografías.  ? Resonancia magnética (RM).  ? Exploración por tomografía computarizada (TC).    ¿Cómo se trata?  En muchos casos, esta afección mejora por sí sola, sin ningún tratamiento. Sin embargo, el tratamiento puede incluir:  · Reducción o modificación de la actividad física en los períodos de dolor.  · Ejercicios y estiramiento para fortalecer el abdomen y mejorar la flexibilidad de la columna vertebral.  · Aplicación de calor o hielo en la zona afectada.  · Medicamentos para lo siguiente:  ?   solamente como se lo haya indicado el mdico.  No conduzca ni opere maquinaria pesada mientras toma analgsicos recetados. Control del dolor  Si se lo indican,  aplique hielo en la zona afectada. ? Ponga el hielo en una bolsa plstica. ? Coloque una FirstEnergy Corptoalla entre la piel y la bolsa de hielo. ? Coloque el hielo durante 20minutos, 2 a 3veces por da.  Despus del hielo, aplique calor sobre la zona afectada antes de Education officer, environmentalrealizar ejercicio o con la frecuencia que le haya indicado el mdico. Use la fuente de calor que el mdico le recomiende, como una compresa de calor hmedo o una almohadilla trmica. ? Coloque una FirstEnergy Corptoalla entre la piel y la fuente de Airline pilotcalor. ? Aplique el calor durante 20 a 30minutos. ? Retire la fuente de calor si la piel se pone de color rojo brillante. Esto es muy importante si no puede sentir el dolor, el calor o el fro. Puede correr un riesgo mayor de sufrir quemaduras. Actividad  Retome sus actividades normales como se lo haya indicado el mdico. Pregntele al mdico qu actividades son seguras para usted. ? Evite las Liberty Mutualactividades que empeoran los sntomas.  Durante el da, descanse durante lapsos breves. Descansar recostado o de pie suele ser mejor que hacerlo sentado. ? Cuando descanse durante perodos ms largos, incorpore alguna [place]actividad suave o ejercicios de Bank of Americaelongacin entre los perodos de descanso. Esto ayudar a Transport plannerevitar la rigidez y Chief Technology Officerel dolor. ? Evite estar sentado durante largos perodos de tiempo sin moverse. Levntese y [place]muvase al menos una vez cada hora.  Haga ejercicio y elongue habitualmente, como se lo haya indicado el mdico.  No levante nada que pese ms de 10libras (4,5kg) mientras tenga sntomas de citica. Aunque no tenga sntomas, evite levantar objetos pesados, en especial en forma repetida.  Siempre use las tcnicas de levantamiento correctas para levantar objetos, entre ellas: ? Flexionar las rodillas. ? Mantener la carga cerca del cuerpo. ? No torcerse. Instrucciones generales  Mantenga una buena postura. ? Evite reclinarse hacia adelante cuando est sentado. ? Evite encorvar la espalda mientras est de  pie.  Mantenga un peso saludable. El exceso de peso ejerce presin adicional sobre la espalda y hace que resulte difcil mantener una buena [place]annettpostura.  Use calzado con buen apoyo y cmodo. Evite usar tacones.  Evite dormir sobre un colchn que sea demasiado blando o demasiado duro. Un colchn que ofrezca un apoyo suficientemente firme para su espalda al dormir puede ayudar a Engineer, materialsaliviar el dolor.  Concurra a todas las visitas de [address]seguimiento como se lo haya indicado el mdico. Esto es importante. Comunquese con un mdico si:  El dolor lo despierta cuando est dormido.  El dolor empeora cuando se [place]amacuesta.  El dolor es peor del que experiment en el pasado.  Los sntomas duran ms de 4 semanas.  Pierde peso en forma inexplicable. Solicite ayuda de inmediato si:  Pierde el control de la vejiga o del intestino (incontinencia).  Tiene los siguientes sntomas: ? Debilidad que empeora en la parte inferior de la espalda, la pelvis, las nalgas o las piernas. ? Enrojecimiento o inflamacin en la espalda. ? Sensacin de ardor al ConocoPhillipsorinar. Esta informacin no tiene Theme park managercomo fin reemplazar el consejo del mdico. Asegrese de hacerle al mdico cualquier pregunta que tenga. Document Released: 03/04/2005 Document Revised: 07/16/2016 Document Reviewed: 11/11/2014 Elsevier Interactive Patient Education  2018 [employer]     IF you received an x-ray today, you will receive an invoice from [facility]. Please contact [facility] at  [phone] with questions or concerns regarding your invoice.   IF you received labwork today, you will receive an invoice from [facility]. Please contact [facility] at [phone] with questions or concerns regarding your invoice.   Our billing staff will not be able to assist you with questions regarding bills from these companies.  You will be contacted with the lab results as soon as they are available. The fastest way to get your results is to activate  your My Chart account. Instructions are located on the last page of this paperwork. If you have not heard from [place] regarding the results in 2 weeks, please contact this office.

## 2017-10-25 NOTE — Progress Notes (Signed)
    MRN: [id number] DOB: September 16, 1985  Subjective:   Natalie Snow is a 32 y.o. female presenting for follow up on low back pain. She reports that it is better. However, she continues to have pain that typically presents at night. Throughout the day it is not as bothersome.  Patient was placed on work restrictions and did well with these. She reports that she has never smoked. She has never used smokeless tobacco. She reports that she does not drink alcohol.   Natalie Snow has a current medication list which includes the following prescription(s): cyclobenzaprine and meloxicam. Also has No Known Allergies.  Natalie Snow  has a past medical history of UTI (urinary tract infection). Also  has a past surgical history that includes Cesarean section.  Objective:   Vitals: BP 124/75   Pulse 76   Temp 98.6 F (37 C)   Resp 16   Ht 5\' 4"  (1.626 m)   Wt 162 lb 6.4 oz (73.7 kg)   LMP 10/14/2017   SpO2 100%   BMI 27.88 kg/m   Physical Exam  Constitutional: She is oriented to person, place, and time. She appears well-developed and well-nourished.  Cardiovascular: Normal rate.  Pulmonary/Chest: Effort normal.  Musculoskeletal:       Lumbar back: She exhibits tenderness (over area outlined but negative SLR). She exhibits normal range of motion, no bony tenderness, no swelling, no edema and no spasm.       Back:  Neurological: She is alert and oriented to person, place, and time. She displays normal reflexes. Coordination normal.   Assessment and Plan :   Low back strain, subsequent encounter  Low back pain, unspecified back pain laterality, unspecified chronicity, with sciatica presence unspecified  Contusion of lower back, subsequent encounter  Patient has improved but still having issues.  She is no longer having radicular pain.  However, due to persistent low back pain she would like to try something little stronger like prednisone.  We will also switch her from Flexeril to Robaxin to address  the drowsiness she experiences.  She has followed work restrictions well.  She is ready to lift some of those in terms of the weight that she can lift.  We will follow-up with another provider since I will be on PAL.  Recheck in 2 weeks.  Wallis BambergMario Siera [place]yers[place], PA-C [facility] [phone] 10/25/2017 2:27 PM

## 2018-01-06 DIAGNOSIS — Z23 Encounter for immunization: Secondary | ICD-10-CM | POA: Diagnosis not present

## 2018-09-01 ENCOUNTER — Encounter (HOSPITAL_COMMUNITY): Payer: Self-pay | Admitting: *Deleted

## 2018-09-01 ENCOUNTER — Other Ambulatory Visit: Payer: Self-pay

## 2018-09-01 ENCOUNTER — Inpatient Hospital Stay (HOSPITAL_COMMUNITY)
Admission: AD | Admit: 2018-09-01 | Discharge: 2018-09-01 | Disposition: A | Discharge status: Home / Self Care | Payer: BC Managed Care – PPO | Attending: Obstetrics and Gynecology | Admitting: Obstetrics and Gynecology

## 2018-09-01 ENCOUNTER — Inpatient Hospital Stay (HOSPITAL_COMMUNITY): Payer: BC Managed Care – PPO

## 2018-09-01 DIAGNOSIS — O360910 Maternal care for other rhesus isoimmunization, first trimester, not applicable or unspecified: Secondary | ICD-10-CM | POA: Diagnosis not present

## 2018-09-01 DIAGNOSIS — O209 Hemorrhage in early pregnancy, unspecified: Secondary | ICD-10-CM

## 2018-09-01 DIAGNOSIS — Z3A13 13 weeks gestation of pregnancy: Secondary | ICD-10-CM | POA: Diagnosis not present

## 2018-09-01 DIAGNOSIS — O26851 Spotting complicating pregnancy, first trimester: Secondary | ICD-10-CM | POA: Diagnosis not present

## 2018-09-01 DIAGNOSIS — O26859 Spotting complicating pregnancy, unspecified trimester: Secondary | ICD-10-CM | POA: Diagnosis not present

## 2018-09-01 DIAGNOSIS — Z6710 Type A blood, Rh positive: Secondary | ICD-10-CM

## 2018-09-01 DIAGNOSIS — Z3A01 Less than 8 weeks gestation of pregnancy: Secondary | ICD-10-CM | POA: Diagnosis not present

## 2018-09-01 DIAGNOSIS — Z3490 Encounter for supervision of normal pregnancy, unspecified, unspecified trimester: Secondary | ICD-10-CM

## 2018-09-01 LAB — CBC
HCT: 41.7 % (ref 36.0–46.0)
Hemoglobin: 13.6 g/dL (ref 12.0–15.0)
MCH: 29 pg (ref 26.0–34.0)
MCHC: 32.6 g/dL (ref 30.0–36.0)
MCV: 88.9 fL (ref 80.0–100.0)
Platelets: 217 10*3/uL (ref 150–400)
RBC: 4.69 MIL/uL (ref 3.87–5.11)
RDW: 13 % (ref 11.5–15.5)
WBC: 5.2 10*3/uL (ref 4.0–10.5)
nRBC: 0 % (ref 0.0–0.2)

## 2018-09-01 LAB — POCT PREGNANCY, URINE: Preg Test, Ur: POSITIVE — AB

## 2018-09-01 LAB — WET PREP, GENITAL
Clue Cells Wet Prep HPF POC: NONE SEEN
Sperm: NONE SEEN
Trich, Wet Prep: NONE SEEN
Yeast Wet Prep HPF POC: NONE SEEN

## 2018-09-01 LAB — HCG, QUANTITATIVE, PREGNANCY: hCG, Beta Chain, Quant, S: 18448 m[IU]/mL — ABNORMAL HIGH (ref <5)

## 2018-09-01 NOTE — Discharge Instructions (Signed)
Primer trimestre de embarazo  First Trimester of Pregnancy  El primer trimestre de embarazo se extiende desde la semana1 hasta el final de la semana13 (mes1 al mes3). Una semana despus de que un espermatozoide fecunda un vulo, este se implantar en la pared uterina. Este embrin comenzar a desarrollarse hasta convertirse en un beb. Sus genes y los de su pareja forman el beb. Los genes del varn determinan si ser un nio o una nia. Entre la semana6 y la8, se forman los ojos y el rostro, y los latidos del corazn pueden verse en una ecografa. Al final de las 12semanas, todos los rganos del beb estn formados.  Ahora que est embarazada, querr hacer todo lo que est a su alcance para tener un beb sano. Dos de las cosas ms importantes son tener un buen cuidado prenatal y seguir las indicaciones del mdico. El cuidado prenatal incluye toda la asistencia mdica que usted recibe antes del nacimiento del beb. Esto ayudar a prevenir, detectar y tratar cualquier problema durante el embarazo y el parto.  Durante el primer trimestre, ocurren cambios en el cuerpo.  Su organismo atraviesa por muchos cambios durante el embarazo. Estos cambios varan de una mujer a otra.   Al principio, puede aumentar o bajar algunos kilos.   Puede tener malestar estomacal (nuseas) y vomitar. Si no puede controlar los vmitos, llame al mdico.   Puede cansarse con facilidad.   Es posible que tenga dolores de cabeza que pueden aliviarse con ciertos medicamentos. Todos los medicamentos que tome deben estar aprobados por el mdico.   Puede orinar con mayor frecuencia. El dolor al orinar puede significar que usted tiene una infeccin de la vejiga.   Debido al embarazo, puede tener acidez estomacal.   Puede estar estreida, ya que ciertas hormonas enlentecen los movimientos de los msculos que empujan las heces a travs del intestino.   Pueden aparecer hemorroides o hincharse las venas (venas varicosas).   Las mamas  pueden empezar a agrandarse y estar sensibles. Los pezones pueden sobresalir ms, y el tejido que los rodea (arola) tornarse ms oscuro.   Las encas pueden sangrar y estar sensibles al cepillado y al hilo dental.   Pueden aparecer zonas oscuras o manchas (cloasma, mscara del embarazo) en el rostro. Esto probablemente se atenuar despus del nacimiento del beb.   Los perodos menstruales se interrumpirn.   Tal vez no tenga apetito.   Puede sentir un fuerte deseo de consumir ciertos alimentos.   Puede tener cambios a nivel emocional da a da, por ejemplo, por momentos puede estar emocionada por el embarazo y por otros preocuparse porque algo pueda salir mal con el embarazo o el beb.   Tendr sueos ms vvidos y extraos.   Tal vez haya cambios en el cabello. Esto cambios pueden incluir su engrosamiento, crecimiento rpido y cambios en la textura. Adems, a algunas mujeres se les cae el cabello durante o despus del embarazo, o tienen el cabello seco o fino. Lo ms probable es que el cabello se le normalice despus del nacimiento del beb.  Qu debe esperar en las visitas prenatales  Durante una visita prenatal de rutina:   La pesarn para asegurarse de que usted y el beb estn creciendo normalmente.   Le tomarn la presin arterial.   Le medirn el abdomen para controlar el desarrollo del beb.   Se escucharn los latidos cardacos fetales entre las semanas10 y14 de embarazo.   Se analizarn los resultados de los estudios solicitados en visitas   anteriores.  El mdico puede preguntarle lo siguiente:   Cmo se siente.   Si siente los movimientos del beb.   Si ha tenido sntomas anormales, como prdida de lquido, sangrado, dolores de cabeza intensos o clicos abdominales.   Si est consumiendo algn producto que contenga tabaco, como cigarrillos, tabaco de mascar y cigarrillos electrnicos.   Si tiene alguna pregunta.  Otros estudios que pueden realizarse durante el primer trimestre  incluyen lo siguiente:   Anlisis de sangre para determinar el grupo sanguneo y detectar la presencia de infecciones previas. Las pruebas tambin se utilizarn para determinar si tiene bajo nivel de hierro (anemia) y protenas en los glbulos rojos (anticuerpos Rh). En funcin de sus factores de riesgo, o si ya tuvo diabetes durante un embarazo anterior, le pueden hacer pruebas para determinar si tiene un nivel alto de azcar en la sangre, algo que puede afectar a embarazadas (diabetes gestacional).   Anlisis de orina para detectar infecciones, diabetes o protenas en la orina.   Una ecografa para confirmar que el beb crece y se desarrolla correctamente.   Estudios fetales para detectar problemas de la mdula espinal (espina bfida) y sndrome de Down.   Prueba del VIH (virus de inmunodeficiencia humana). Los exmenes prenatales de rutina incluyen la prueba de deteccin del VIH, a menos que decida no realizrsela.   Es posible que necesite otras pruebas adicionales.  Siga estas instrucciones en su casa:  Medicamentos   Siga las indicaciones del mdico en relacin con el uso de medicamentos. Durante el embarazo, hay medicamentos que pueden tomarse y otros que no.   Tome vitaminas prenatales que contengan por lo menos 600microgramos (?g) de cido flico.   Si est estreida, tome un laxante suave, si el mdico lo autoriza.  Comida y bebida     Lleve una dieta equilibrada que incluya gran cantidad de frutas y verduras frescas, cereales integrales, buenas fuentes de protenas como carnes magras, huevos o tofu, y lcteos descremados. El mdico la ayudar a determinar la cantidad de peso que puede aumentar.   No coma carne cruda ni quesos sin cocinar. Estos elementos contienen grmenes que pueden causar defectos congnitos en el beb.   La ingesta diaria de cuatro o cinco comidas pequeas en lugar de tres comidas abundantes puede ayudar a aliviar las nuseas y los vmitos. Si empieza a tener nuseas,  comer algunas galletas saladas puede ser de ayuda. Beber lquidos entre las comidas, en lugar de tomarlos durante las comidas, tambin puede ayudar a aliviar las nuseas y los vmitos.   Limite el consumo de alimentos con alto contenido de grasas y azcares procesados, como alimentos fritos o dulces.   Para evitar el estreimiento:  ? Consuma alimentos ricos en fibra, como frutas y verduras frescas, cereales integrales y frijoles.  ? Beba suficiente lquido para mantener la orina clara o de color amarillo plido.  Actividad   Haga ejercicio solamente como se lo haya indicado el mdico. La mayora de las mujeres pueden continuar su rutina de ejercicios durante el embarazo. Intente realizar como mnimo 30minutos de actividad fsica por lo menos 5das a la semana. El ejercicio la ayudar a:  ? Controlar su peso.  ? Mantenerse en forma.  ? Estar preparada para el trabajo de parto y el parto.   Los dolores, los clicos en la parte baja del abdomen o los calambres en la cintura son un buen indicio de que debe dejar de hacer ejercicios. Consulte al mdico antes de seguir haciendo ejercicios con   normalidad.   Intente no estar de pie durante mucho tiempo. Mueva las piernas con frecuencia si debe estar de pie en un lugar durante mucho tiempo.   Evite levantar pesos excesivos.   Use zapatos de tacones bajos y mantenga una buena postura.   Puede seguir teniendo relaciones sexuales, salvo que el mdico le indique lo contrario.  Alivio del dolor y del malestar   Use un sostn que le brinde buen soporte para aliviar el dolor de mamas.   Dese baos de asiento con agua tibia para aliviar el dolor o las molestias causadas por las hemorroides. Use una crema para las hemorroides si el mdico la autoriza.   Descanse con las piernas elevadas si tiene calambres o dolor de cintura.   Si tiene venas varicosas en las piernas, use medias de descanso. Eleve los pies durante 15minutos, 3 o 4veces por da. Limite el consumo de  sal en su dieta.  Cuidados prenatales   Programe las visitas prenatales para la semana12 de embarazo. Generalmente se programan cada mes al principio y se hacen ms frecuentes en los 2 ltimos meses antes del parto.   Escriba sus preguntas. Llvelas cuando concurra a las visitas prenatales.   Concurra a todas las visitas prenatales tal como se lo haya indicado el mdico. Esto es importante.  Seguridad   Use el cinturn de seguridad en todo momento mientras conduce.   Haga una lista de los nmeros de telfono de emergencia, que incluya los nmeros de telfono de familiares, amigos, el hospital y los departamentos de polica y bomberos.  Instrucciones generales   Pdale al mdico que la derive a clases de educacin prenatal en su localidad. Debe comenzar a tomar las clases antes de que empiece el mes6 de embarazo.   Pida ayuda si tiene necesidades nutricionales o de asesoramiento durante el embarazo. El mdico puede aconsejarla o derivarla a especialistas para que la ayuden con diferentes necesidades.   No se d baos de inmersin en agua caliente, baos turcos ni saunas.   No se haga duchas vaginales ni use tampones o toallas higinicas perfumadas.   No mantenga las piernas cruzadas durante mucho tiempo.   Evite el contacto con las bandejas sanitarias de los gatos y la tierra que estos animales usan. Estos elementos contienen bacterias que pueden causar defectos congnitos al beb y la posible prdida del feto debido a un aborto espontneo o muerte fetal.   No fume, no consuma hierbas ni medicamentos que no hayan sido recetados por el mdico. Las sustancias qumicas que estos productos contienen afectan la formacin y el desarrollo del beb.   No consuma ningn producto que contenga nicotina o tabaco, como cigarrillos y cigarrillos electrnicos. Si necesita ayuda para dejar de fumar, consulte al mdico. Puede recibir asesoramiento y otro tipo de recursos para dejar de fumar.   Programe una cita con el  dentista. En su casa, lvese los dientes con un cepillo dental blando y psese el hilo dental con suavidad.  Comunquese con un mdico si:   Tiene mareos.   Siente clicos leves, presin en la pelvis o dolor persistente en el abdomen.   Tiene nuseas, vmitos o diarrea persistentes.   Observa una secrecin vaginal con mal olor.   Siente dolor al orinar.   Observa ms hinchazn en la cara, las manos, las piernas o los tobillos.   Est expuesta a la quinta enfermedad o a la varicela.   Est expuesta al sarampin alemn (rubola) y nunca lo haba tenido.    Solicite ayuda de inmediato si:   Tiene fiebre.   Tiene una prdida de lquido por la vagina.   Tiene sangrado o pequeas prdidas vaginales.   Siente dolor intenso o clicos en el abdomen.   Sube o baja de peso rpidamente.   Vomita sangre de color rojo brillante o una sustancia similar a los granos de caf.   Dolor de cabeza intenso.   Le falta el aire.   Sufre cualquier tipo de traumatismo, por ejemplo, debido a una cada o un accidente automovilstico.  Resumen   El primer trimestre de embarazo se extiende desde la semana1 hasta el final de la semana13 (mes1 al mes3).   Su organismo atraviesa por muchos cambios durante el embarazo. Estos cambios varan de una mujer a otra.   Tendr visitas prenatales de rutina. Durante esas visitas, el mdico la examinar, hablar con usted acerca de los resultados de sus pruebas y le preguntar cmo se siente.  Esta informacin no tiene como fin reemplazar el consejo del mdico. Asegrese de hacerle al mdico cualquier pregunta que tenga.  Document Released: 12/12/2004 Document Revised: 06/07/2016 Document Reviewed: 06/07/2016  Elsevier Interactive Patient Education  2019 Elsevier Inc.

## 2018-09-01 NOTE — MAU Note (Signed)
+  HPT yesterday.  Today she started bleeding. No period in April, is very irregular. Some bleeding 5/20.  Some pressure/ pain in low back.

## 2018-09-01 NOTE — MAU Provider Note (Signed)
History     CSN: [id number]  Arrival date and time: 09/01/18 1303   First Provider Initiated Contact with Patient 09/01/18 1418      Chief Complaint  Patient presents with  . Vaginal Bleeding  . Possible Pregnancy   Natalie Snow is a 33 y.o. G3P2 at 5397w0d who presents today with vaginal bleeding.  Vaginal Bleeding The patient's primary symptoms include vaginal bleeding. The patient's pertinent negatives include no pelvic pain. This is a new problem. The current episode started 1 to 4 weeks ago. The problem occurs intermittently. The problem has been unchanged. The patient is experiencing no pain. She is pregnant. Associated symptoms include back pain. Pertinent negatives include no chills, dysuria, fever, frequency, nausea or vomiting. The vaginal discharge was bloody. The vaginal bleeding is lighter than menses. She has not been passing clots. She has not been passing tissue. Nothing aggravates the symptoms. She has tried nothing for the symptoms. She uses nothing for contraception.    OB History    Gravida  3   Para  2   Term      Preterm      AB      Living  2     SAB      TAB      Ectopic      Multiple      Live Births              Past Medical History:  Diagnosis Date  . UTI (urinary tract infection)     Past Surgical History:  Procedure Laterality Date  . CESAREAN SECTION     x2    Family History  Problem Relation Age of Onset  . Thyroid disease Mother   . Thyroid disease Sister     Social History   Tobacco Use  . Smoking status: Never Smoker  . Smokeless tobacco: Never Used  Substance Use Topics  . Alcohol use: No  . Drug use: Never    Allergies: No Known Allergies  Medications Prior to Admission  Medication Sig Dispense Refill Last Dose  . meloxicam (MOBIC) 7.5 MG tablet Take 1 tablet (7.5 mg total) by mouth daily. 30 tablet 1   . methocarbamol (ROBAXIN) 500 MG tablet Take 1 tablet (500 mg total) by mouth at bedtime as  needed for muscle spasms. 30 tablet 1   . predniSONE (DELTASONE) 20 MG tablet Take 2 tablets daily with breakfast. 10 tablet 0     Review of Systems  Constitutional: Negative for chills and fever.  Gastrointestinal: Negative for nausea and vomiting.  Genitourinary: Positive for vaginal bleeding. Negative for dysuria, frequency and pelvic pain.  Musculoskeletal: Positive for back pain.   Physical Exam   Blood pressure 137/86, pulse 98, temperature 98.2 F (36.8 C), temperature source Oral, resp. rate 17, weight 73.3 kg, last menstrual period 06/02/2018, SpO2 100 %.  Physical Exam  Nursing note and vitals reviewed. Constitutional: She is oriented to person, place, and time. She appears well-developed and well-nourished. No distress.  HENT:  Head: Normocephalic.  Cardiovascular: Normal rate.  Respiratory: Effort normal.  GI: Soft. There is no abdominal tenderness. There is no rebound.  Neurological: She is alert and oriented to person, place, and time.  Skin: Skin is warm and dry.  Psychiatric: She has a normal mood and affect.   Results for orders placed or performed during the hospital encounter of 09/01/18 (from the past 24 hour(s))  Pregnancy, urine POC     Status: Abnormal  Collection Time: 09/01/18  1:33 PM  Result Value Ref Range   Preg Test, Ur POSITIVE (A) NEGATIVE  ABO/Rh     Status: None   Collection Time: 09/01/18  2:16 PM  Result Value Ref Range   ABO/RH(D)      A POS Performed at [facility] [address], [place], [place] [address]   CBC     Status: None   Collection Time: 09/01/18  2:20 PM  Result Value Ref Range   WBC 5.2 4.0 - 10.5 K/uL   RBC 4.69 3.87 - 5.11 MIL/uL   Hemoglobin 13.6 12.0 - 15.0 g/dL   HCT 41.7 36.0 - 46.0 %   MCV 88.9 80.0 - 100.0 fL   MCH 29.0 26.0 - 34.0 pg   MCHC 32.6 30.0 - 36.0 g/dL   RDW 13.0 11.5 - 15.5 %   Platelets 217 150 - 400 K/uL   nRBC 0.0 0.0 - 0.2 %  Wet prep, genital     Status: Abnormal   Collection  Time: 09/01/18  2:20 PM   Specimen: Vaginal  Result Value Ref Range   Yeast Wet Prep HPF POC NONE SEEN NONE SEEN   Trich, Wet Prep NONE SEEN NONE SEEN   Clue Cells Wet Prep HPF POC NONE SEEN NONE SEEN   WBC, Wet Prep HPF POC MANY (A) NONE SEEN   Sperm NONE SEEN   hCG, quantitative, pregnancy     Status: Abnormal   Collection Time: 09/01/18  2:40 PM  Result Value Ref Range   hCG, Beta Chain, Quant, S 18,448 (H) <5 mIU/mL   US Ob Less Than 14 Weeks With Ob Transvaginal  Result Date: 09/01/2018 CLINICAL DATA:  Vaginal bleeding since yesterday. Positive pregnancy test. EXAM: OBSTETRIC <14 WK [place] AND TRANSVAGINAL OB US TECHNIQUE: Both transabdominal and transvaginal ultrasound examinations were performed for complete evaluation of the gestation as well as the maternal uterus, adnexal regions, and pelvic cul-de-sac. Transvaginal technique was performed to assess early pregnancy. COMPARISON:  None. FINDINGS: Intrauterine gestational sac: Present Yolk sac:  Present Embryo:  None Cardiac Activity: N/A Heart Rate: N/A bpm MSD: 9.18 mm   5 w   5 d Subchorionic hemorrhage:  None visualized. Maternal uterus/adnexae: Both ovaries are normal. No free pelvic fluid collections. IMPRESSION: Early intrauterine gestational sac and small yolk sac but no embryonic pole, or cardiac activity yet visualized. Recommend follow-up quantitative B-HCG levels and follow-up US in 14 days to assess viability. This recommendation follows SRU consensus guidelines: Diagnostic Criteria for Nonviable Pregnancy Early in the First Trimester. [place] Med 2013; 299:3716-96. Electronically Signed   By: Marijo Sanes M.D.   On: 09/01/2018 15:57    MAU Course  Procedures  MDM  Assessment and Plan   1. Intrauterine pregnancy   2. Vaginal bleeding in pregnancy, first trimester   3. [redacted] weeks gestation of pregnancy   4. Type A blood, Rh positive    DC home Comfort measures reviewed  1stTrimester precautions  Bleeding  precautions RX: none  Return to MAU as needed FU with [place] in 2 weeks ordered     Marcille Buffy DNP, CNM  09/01/18  4:07 PM

## 2018-09-02 LAB — GC/CHLAMYDIA PROBE AMP (~~LOC~~) NOT AT ARMC
Chlamydia: NEGATIVE
Neisseria Gonorrhea: NEGATIVE

## 2018-09-02 LAB — ABO/RH: ABO/RH(D): A POS

## 2018-09-15 ENCOUNTER — Ambulatory Visit (HOSPITAL_COMMUNITY)
Admission: RE | Admit: 2018-09-15 | Discharge: 2018-09-15 | Disposition: A | Discharge status: Home / Self Care | Payer: BC Managed Care – PPO | Source: Ambulatory Visit | Attending: Advanced Practice Midwife | Admitting: Advanced Practice Midwife

## 2018-09-15 ENCOUNTER — Other Ambulatory Visit: Payer: Self-pay

## 2018-09-15 ENCOUNTER — Ambulatory Visit (INDEPENDENT_AMBULATORY_CARE_PROVIDER_SITE_OTHER): Payer: BC Managed Care – PPO

## 2018-09-15 ENCOUNTER — Encounter: Payer: Self-pay | Admitting: Family Medicine

## 2018-09-15 DIAGNOSIS — Z712 Person consulting for explanation of examination or test findings: Secondary | ICD-10-CM

## 2018-09-15 DIAGNOSIS — Z3A01 Less than 8 weeks gestation of pregnancy: Secondary | ICD-10-CM | POA: Diagnosis not present

## 2018-09-15 DIAGNOSIS — Z3490 Encounter for supervision of normal pregnancy, unspecified, unspecified trimester: Secondary | ICD-10-CM | POA: Insufficient documentation

## 2018-09-15 DIAGNOSIS — Z6710 Type A blood, Rh positive: Secondary | ICD-10-CM | POA: Insufficient documentation

## 2018-09-15 DIAGNOSIS — O4691 Antepartum hemorrhage, unspecified, first trimester: Secondary | ICD-10-CM | POA: Diagnosis not present

## 2018-09-15 DIAGNOSIS — O4690 Antepartum hemorrhage, unspecified, unspecified trimester: Secondary | ICD-10-CM | POA: Diagnosis not present

## 2018-09-15 DIAGNOSIS — O209 Hemorrhage in early pregnancy, unspecified: Secondary | ICD-10-CM | POA: Insufficient documentation

## 2018-09-15 NOTE — Progress Notes (Signed)
I have reviewed this chart and agree with the RN/CMA assessment and management.    K. Meryl Davis, M.D. Attending [facility] (Faculty Practice)   

## 2018-09-15 NOTE — Progress Notes (Signed)
Pt here today for [place] results.  With spanish interpreter [name] R., pt informed that she has a good pregnancy.  FHR  162 bpm and that she her EDD is 04/29/19.  Pt denies any pain or bleeding.  Medications reconciled.  List of medications safe to take in pregnancy given.  [place] office to provide proof of pregnancy letter to start prenatal care.    Mel Almond, RN 09/15/18

## 2018-10-05 ENCOUNTER — Telehealth: Payer: Self-pay | Admitting: Family Medicine

## 2018-10-05 NOTE — Telephone Encounter (Signed)
Attempted to call patient w/ Spanish interpreter ID # [phone] about her appointment on 7/21 @ 9:15. No answer, interpreter left voicemail instructing patient that the visit is a telephone visit and she did not have to come to the office. Patient instructed to give the office a call back if she is needing to reschedule.

## 2018-10-06 ENCOUNTER — Ambulatory Visit (INDEPENDENT_AMBULATORY_CARE_PROVIDER_SITE_OTHER): Payer: BC Managed Care – PPO | Admitting: *Deleted

## 2018-10-06 ENCOUNTER — Other Ambulatory Visit: Payer: Self-pay

## 2018-10-06 DIAGNOSIS — O0990 Supervision of high risk pregnancy, unspecified, unspecified trimester: Secondary | ICD-10-CM

## 2018-10-06 DIAGNOSIS — O34219 Maternal care for unspecified type scar from previous cesarean delivery: Secondary | ICD-10-CM

## 2018-10-06 DIAGNOSIS — O09521 Supervision of elderly multigravida, first trimester: Secondary | ICD-10-CM

## 2018-10-06 NOTE — Progress Notes (Signed)
I connected with  Natalie Snow on 10/06/18 at  9:15 AM EDT by telephone and verified that I am speaking with the correct person using two identifiers.   I discussed the limitations, risks, security and privacy concerns of performing an evaluation and management service by telephone and the availability of in person appointments. I also discussed with the patient that there may be a patient responsible charge related to this service. The patient expressed understanding and agreed to proceed.  [place] interpreter [phone] used for encounter.  New Ob intake completed. Pt was advised that ehr prenatal care will include a combination of virtual, telephone and face to face visits. Covid visitor restrictions explained. Pt states she has been having some H/A's and they are relieved by Tylenol. She had some spotting in May which has now resolved. US done 6/30 showed viable IUP with EDD 04/29/19. Pt agreed to check BP weekly and record results to [facility] portal. BP cuff will be ordered and sent to pt's home. Pt requests Rx for prenatal vitamins which will be e-prescribed as requested. Pt states she had last Pap in [place] September 2019 and was normal. Previous Pap done 08/29/14 @ [facility]. was also normal. Pt voiced understanding of all information and instructions given.   Day, Ronnell Freshwater, RN 10/06/2018  4:21 PM

## 2018-10-08 ENCOUNTER — Encounter: Payer: Self-pay | Admitting: *Deleted

## 2018-10-08 DIAGNOSIS — O34219 Maternal care for unspecified type scar from previous cesarean delivery: Secondary | ICD-10-CM | POA: Insufficient documentation

## 2018-10-08 DIAGNOSIS — O0990 Supervision of high risk pregnancy, unspecified, unspecified trimester: Secondary | ICD-10-CM | POA: Insufficient documentation

## 2018-10-08 DIAGNOSIS — O09529 Supervision of elderly multigravida, unspecified trimester: Secondary | ICD-10-CM | POA: Insufficient documentation

## 2018-10-08 MED ORDER — PRENATAL VITAMINS 28-0.8 MG PO TABS: 1.0000 | ORAL_TABLET | Freq: Every day | ORAL | 12 refills | Status: DC

## 2018-10-08 MED ORDER — AMBULATORY NON FORMULARY MEDICATION: 1.0000 | 0 refills | Status: DC

## 2018-10-13 ENCOUNTER — Encounter: Payer: Self-pay | Admitting: Family Medicine

## 2018-10-13 ENCOUNTER — Ambulatory Visit (INDEPENDENT_AMBULATORY_CARE_PROVIDER_SITE_OTHER): Payer: BC Managed Care – PPO | Admitting: Family Medicine

## 2018-10-13 ENCOUNTER — Other Ambulatory Visit: Payer: Self-pay

## 2018-10-13 VITALS — BP 121/80 | HR 98 | Temp 97.8°F | Wt 163.3 lb

## 2018-10-13 DIAGNOSIS — Z124 Encounter for screening for malignant neoplasm of cervix: Secondary | ICD-10-CM

## 2018-10-13 DIAGNOSIS — O34219 Maternal care for unspecified type scar from previous cesarean delivery: Secondary | ICD-10-CM

## 2018-10-13 DIAGNOSIS — Z113 Encounter for screening for infections with a predominantly sexual mode of transmission: Secondary | ICD-10-CM | POA: Diagnosis not present

## 2018-10-13 DIAGNOSIS — O0990 Supervision of high risk pregnancy, unspecified, unspecified trimester: Secondary | ICD-10-CM

## 2018-10-13 DIAGNOSIS — Z3481 Encounter for supervision of other normal pregnancy, first trimester: Secondary | ICD-10-CM | POA: Diagnosis not present

## 2018-10-13 DIAGNOSIS — Z315 Encounter for genetic counseling: Secondary | ICD-10-CM | POA: Diagnosis not present

## 2018-10-13 DIAGNOSIS — Z1151 Encounter for screening for human papillomavirus (HPV): Secondary | ICD-10-CM

## 2018-10-13 DIAGNOSIS — Z3A11 11 weeks gestation of pregnancy: Secondary | ICD-10-CM

## 2018-10-13 DIAGNOSIS — G43809 Other migraine, not intractable, without status migrainosus: Secondary | ICD-10-CM

## 2018-10-13 DIAGNOSIS — G43909 Migraine, unspecified, not intractable, without status migrainosus: Secondary | ICD-10-CM | POA: Insufficient documentation

## 2018-10-13 DIAGNOSIS — O0991 Supervision of high risk pregnancy, unspecified, first trimester: Secondary | ICD-10-CM

## 2018-10-13 MED ORDER — ASPIRIN-ACETAMINOPHEN-CAFFEINE 250-250-65 MG PO TABS: 1.0000 | ORAL_TABLET | Freq: Every day | ORAL | 0 refills | Status: DC | PRN

## 2018-10-13 NOTE — Progress Notes (Signed)
Subjective:   Natalie Snow is a 33 y.o. G3P2002 at 38w5dby LMP being seen today for her first obstetrical visit.  Her obstetrical history is significant for two prior C/S. Patient does intend to breast feed. Pregnancy history fully reviewed.  - has migraines, usually worse with pregnancy - Tylenol, vomiting, both sides, not much blurry vision - 2 to 3 days per week  ibuprofen usually helps more  - just pink discharge occasionally with wiping, no red bleeding, no cramping  - no complications with other pregnancies  - [facility]   HISTORY: OB History  Gravida Para Term Preterm AB Living  _0 0 0 2  SAB TAB Ectopic Multiple Live Births  0 0 0 0 2    # Outcome Date GA Lbr Len/2nd Weight Sex Delivery Anes PTL Lv  3 Current           2 Term 03/28/08 386w0d8 lb 2 oz (3.685 kg) F CS-LTranv   LIV  1 Term 11/19/02 4139w0d lb 2 oz (3.685 kg) M CS-LTranv   LIV     Complications: Fetal Intolerance   [place] pap [place] and reported as normal but no results available.  Past Medical History:  Diagnosis Date  . Medical history non-contributory   . UTI (urinary tract infection)    Past Surgical History:  Procedure Laterality Date  . CESAREAN SECTION     x2   2004, 2010   Family History  Problem Relation Age of Onset  . Thyroid disease Mother   . Thyroid disease Sister    Social History   Tobacco Use  . Smoking status: Never Smoker  . Smokeless tobacco: Never Used  Substance Use Topics  . Alcohol use: No  . Drug use: Never   No Known Allergies Current Outpatient Medications on File Prior to Visit  Medication Sig Dispense Refill  . Prenatal Vit-Fe Fumarate-FA (PRENATAL VITAMINS) 28-0.8 MG TABS Take 1 tablet by mouth daily. 30 tablet 12  . AMBULATORY NON FORMULARY MEDICATION 1 Device by Other route once a week. Blood pressure cuff/Monitored regularly at home.  ICD 10: Z34.90 (Patient not taking: Reported on 10/13/2018) 1 kit 0    No current facility-administered medications on file prior to visit.      Exam   Vitals:   10/13/18 1345  BP: 121/80  Pulse: 98  Temp: 97.8 F (36.6 C)  Weight: 163 lb 4.8 oz (74.1 kg)   Fetal Heart Rate (bpm): 165  Uterus:   appropriate for gestational age, non-tender fundus  Pelvic Exam: Perineum: no hemorrhoids, normal perineum   Vulva: normal external genitalia, no lesions   Vagina:  normal mucosa, normal discharge   Cervix: no lesions and normal, pap smear done.    Adnexa: normal adnexa and no mass, fullness, tenderness   Bony Pelvis: average  System: General: well-developed, well-nourished female in no acute distress   Breast:  normal appearance, no masses or tenderness   Skin: normal coloration and turgor, no rashes   Neurologic: oriented, normal, negative, normal mood   Extremities: normal strength, tone, and muscle mass, ROM of all joints is normal   HEENT PERRL, extraocular movement intact and sclera clear, anicteric   Mouth/Teeth mucous membranes moist, pharynx normal without lesions and dental hygiene good   Neck supple and no masses   Cardiovascular: regular rate and rhythm   Respiratory:  no respiratory distress, normal  breath sounds   Abdomen: soft, non-tender; bowel sounds normal; no masses,  no organomegaly     Assessment:   Pregnancy: [id number] Patient Active Problem List   Diagnosis Date Noted  . Migraines 10/13/2018  . Supervision of high risk pregnancy, antepartum 10/08/2018  . History of cesarean delivery, currently pregnant 10/08/2018  . Advanced maternal age in multigravida 10/08/2018     Plan:  1. Supervision of high risk pregnancy, antepartum -- [place] MFM OB DETAIL +14 WK; Future -- Obstetric Panel, Including HIV -- Genetic Screening -- Culture, OB Urine -- Cytology - PAP( [facility]) + G/C -- Continue prenatal vitamins.  2. History of cesarean delivery, currently pregnant -- desires TOLAC if labors spontaneously, otherwise repeat  scheduled C/S  3. Other migraine without status migrainosus, not intractable -- aspirin-acetaminophen-caffeine (EXCEDRIN MIGRAINE) 250-250-65 MG tablet; Take 1 tablet by mouth daily as needed for migraine.  Dispense: 30 tablet; Refill: 0  The nature of [place] with multiple MDs and other Advanced Practice Providers was explained to patient; also emphasized that residents, students are part of our team.Routine obstetric precautions reviewed.  Return in about 8 weeks (around 12/08/2018) for  in-person same day as anatomy U/S .  Lambert Mody. Juleen China, DO OB/GYN Fellow

## 2018-10-13 NOTE — Patient Instructions (Signed)
Alivio del dolor durante el trabajo de parto y el parto (Pain Relief During Labor and Delivery) Muchas cosas pueden causar dolor durante el Raglesvilletrabajo de parto y el parto, entre ellas, las siguientes:  La presin en los huesos y los ligamentos debido al paso del beb a travs de la pelvis.  La distensin de los tejidos debido al paso del beb a travs del canal de parto.  La tensin muscular debido a la ansiedad y al nerviosismo.  La contraccin y la relajacin del tero para ayudar al paso del beb. Hay muchas formas de lidiar con el dolor del D'Ibervilletrabajo de Algonquinparto y del parto. Estas incluyen las siguientes:  Tomar clases prenatales. Estas clases ayudan a que se sepa qu esperar durante el nacimiento del beb. Lo que se aprende aumentar la confianza y VF Corporationdisminuir la ansiedad.  Practicar tcnicas de relajacin o hacer actividades relajantes, por ejemplo: ? Respiracin localizada. ? Meditacin. ? Visualizacin. ? Aromaterapia. ? Designer, industrial/productscuchar la msica predilecta. ? Hipnosis.  Tomar una ducha o un bao con agua templada (hidroterapia). Esto puede lograr lo siguiente: ? Event organiserBrindar bienestar y relajacin. ? Disminuir la percepcin del dolor. ? Reducir la cantidad de analgsicos que se necesitan. ? Reducir de la duracin del trabajo de Comfortparto.  Recibir un masaje o contrapresin en la espalda.  Aplicar compresas calientes o de hielo.  Cambiar con frecuencia de posicin, moverse o usar una pelota de [place]parto.  Recibir lo siguiente: ? Analgsicos a travs de una va intravenosa (IV) o de una inyeccin intramuscular. ? Analgsicos que se administran en la columna vertebral. ? Inyecciones de agua estril que se aplican justo debajo de la piel en la parte baja de la espalda (inyecciones intradrmicas). ? Gas hilarante (protxido de nitrgeno). Comente las opciones para el control del dolor con su mdico durante las visitas prenatales. Explore las opciones que ofrecen el hospital o la maternidad. QU  TIPOS DE MEDICAMENTOS HAY DISPONIBLES? Hay dos tipos de medicamentos que se pueden usar para Engineer, materialsaliviar el dolor durante el Lenhartsvilletrabajo de parto y el parto:  Analgsicos. Estos medicamentos Associate Professoralivian el dolor sin causar la prdida de la sensibilidad ni de la capacidad de PACCAR Incmover los msculos.  Anestsicos. Estos medicamentos inhiben la sensibilidad del cuerpo y pueden reducir la capacidad de moverse con libertad. Estos dos tipos de medicamentos pueden producir efectos secundarios leves, como nuseas, dificultad para concentrarse y somnolencia. Tambin pueden disminuir la frecuencia cardaca del beb antes del nacimiento y afectar su frecuencia respiratoria despus de nacer. Por este motivo, los mdicos son cuidadosos respecto de cundo se administran los medicamentos y de su cantidad. CULES SON LOS MEDICAMENTOS Y LOS PROCEDIMIENTOS ESPECFICOS QUE ALIVIAN EL DOLOR? Anestesia local La anestesia local se [place] para adormecer una pequea zona del cuerpo. Se la puede usar junto con otro tipo de anestesia o emplearse para Frontier Oil Corporationadormecer los nervios de la vagina, del cuello del tero y del perineo durante el perodo expulsivo. Anestesia general La anestesia general produce la prdida del conocimiento de modo que no se siente dolor. Generalmente, solo se la [place] para una cesrea de Associate Professoremergencia. La anestesia general se administra a travs de una va intravenosa (IV) y de Earline Mayotteuna mscara. Bloqueo pudendo El bloqueo pudendo es una forma de anestesia local. Se puede utilizar para Acupuncturistaliviar el dolor asociado con la presin o la distensin del perineo en el momento del parto, o para adormecer ms el perineo. Para realizar el bloqueo pudendo, se inyecta anestesia a travs de la pared vaginal en un nervio  de la pelvis. Anestesia epidural La anestesia epidural se administra a travs de un catter intravenoso flexible que se coloca en la parte baja de la espalda. La anestesia se administra de manera continua en la zona cercana a los nervios  de la columna vertebral (espacio epidural). Despus de recibir este tipo de anestesia, tal vez se puedan mover las piernas, pero lo ms probable es que no se pueda caminar. En funcin de la cantidad de anestesia que se administra, se puede perder toda la sensibilidad en la mitad inferior del cuerpo, o bien se puede conservar cierto nivel de sensibilidad, incluida la necesidad de [place]. La anestesia epidural se puede usar para Best boy para un parto vaginal. Bloqueo espinal El bloqueo espinal es similar a la anestesia epidural, pero la anestesia se inyecta en el lquido cefalorraqudeo, en lugar del espacio epidural. El bloqueo espinal solo se aplica una vez. Comienza a Best boy rpidamente, pero el alivio solo dura de 1 a Government social research officer. El bloqueo espinal se puede usar para las cesreas. Bloqueo espinal-epidural combinado El bloqueo espinal-epidural combinado combina los efectos del bloqueo espinal y de la anestesia epidural. Acta rpidamente para bloquear todo Conservation officer, historic buildings. La anestesia epidural alivia el dolor de manera continua, incluso despus de que hayan desaparecido los efectos del bloqueo espinal. Esta informacin no tiene Marine scientist el consejo del mdico. Asegrese de hacerle al mdico cualquier pregunta que tenga. Document Released: 12/30/2008 Document Revised: 11/09/2015 Document Reviewed: 07/26/2015 Elsevier Patient Education  2020 [employer].

## 2018-10-14 ENCOUNTER — Telehealth: Payer: Self-pay | Admitting: Emergency Medicine

## 2018-10-14 LAB — OBSTETRIC PANEL, INCLUDING HIV
Antibody Screen: NEGATIVE
Basophils Absolute: 0 10*3/uL (ref 0.0–0.2)
Basos: 0 %
EOS (ABSOLUTE): 0 10*3/uL (ref 0.0–0.4)
Eos: 0 %
HIV Screen 4th Generation wRfx: NONREACTIVE
Hematocrit: 35.5 % (ref 34.0–46.6)
Hemoglobin: 11.9 g/dL (ref 11.1–15.9)
Hepatitis B Surface Ag: NEGATIVE
Immature Grans (Abs): 0 10*3/uL (ref 0.0–0.1)
Immature Granulocytes: 0 %
Lymphocytes Absolute: 2.3 10*3/uL (ref 0.7–3.1)
Lymphs: 24 %
MCH: 29 pg (ref 26.6–33.0)
MCHC: 33.5 g/dL (ref 31.5–35.7)
MCV: 87 fL (ref 79–97)
Monocytes Absolute: 0.4 10*3/uL (ref 0.1–0.9)
Monocytes: 5 %
Neutrophils Absolute: 6.8 10*3/uL (ref 1.4–7.0)
Neutrophils: 71 %
Platelets: 261 10*3/uL (ref 150–450)
RBC: 4.1 x10E6/uL (ref 3.77–5.28)
RDW: 12.9 % (ref 11.7–15.4)
RPR Ser Ql: NONREACTIVE
Rh Factor: POSITIVE
Rubella Antibodies, IGG: 10 index (ref >0.99)
WBC: 9.6 10*3/uL (ref 3.4–10.8)

## 2018-10-14 NOTE — Telephone Encounter (Signed)
Called pt with interpreter [name] and informed her of anatomy ultrasound appointment for 9/30 @ 10am. Pt was instructed to arrive 15 minutes early for the visit with a full bladder. Pt verbalized understanding and had no further questions.

## 2018-10-15 LAB — URINE CULTURE, OB REFLEX

## 2018-10-15 LAB — CULTURE, OB URINE

## 2018-10-16 ENCOUNTER — Encounter: Payer: Self-pay | Admitting: *Deleted

## 2018-10-16 LAB — CYTOLOGY - PAP
Adequacy: ABSENT
Chlamydia: NEGATIVE
Diagnosis: NEGATIVE
HPV: NOT DETECTED
Neisseria Gonorrhea: NEGATIVE

## 2018-10-21 ENCOUNTER — Encounter: Payer: Self-pay | Admitting: *Deleted

## 2018-10-27 ENCOUNTER — Telehealth: Payer: Self-pay

## 2018-10-27 ENCOUNTER — Encounter: Payer: Self-pay | Admitting: Family Medicine

## 2018-10-27 ENCOUNTER — Other Ambulatory Visit: Payer: Self-pay

## 2018-10-27 DIAGNOSIS — O0990 Supervision of high risk pregnancy, unspecified, unspecified trimester: Secondary | ICD-10-CM

## 2018-10-27 DIAGNOSIS — D563 Thalassemia minor: Secondary | ICD-10-CM

## 2018-10-27 NOTE — Telephone Encounter (Signed)
Called patient via [name] to advised on genetic screening scheduling. lvm to call back for any information. And advised that appointment was scheduled.

## 2018-11-03 ENCOUNTER — Ambulatory Visit (HOSPITAL_COMMUNITY): Payer: Self-pay | Admitting: Obstetrics and Gynecology

## 2018-11-03 ENCOUNTER — Ambulatory Visit (HOSPITAL_COMMUNITY): Payer: BC Managed Care – PPO | Attending: Obstetrics and Gynecology | Admitting: Genetic Counselor

## 2018-11-03 ENCOUNTER — Other Ambulatory Visit: Payer: Self-pay

## 2018-11-03 VITALS — Temp 97.2°F | Wt 161.8 lb

## 2018-11-03 DIAGNOSIS — Z3A14 14 weeks gestation of pregnancy: Secondary | ICD-10-CM

## 2018-11-03 DIAGNOSIS — Z832 Family history of diseases of the blood and blood-forming organs and certain disorders involving the immune mechanism: Secondary | ICD-10-CM

## 2018-11-03 DIAGNOSIS — Z148 Genetic carrier of other disease: Secondary | ICD-10-CM

## 2018-11-03 DIAGNOSIS — Z315 Encounter for genetic counseling: Secondary | ICD-10-CM

## 2018-11-03 DIAGNOSIS — D563 Thalassemia minor: Secondary | ICD-10-CM

## 2018-11-03 NOTE — Progress Notes (Signed)
11/03/2018  Natalie Snow 07/08/1985 MRN: [id number] DOV: 11/03/2018  Natalie Snow presented to the [facility] for a genetics consultation regarding her alpha-thalassemia carrier status. [name] came to her appointment alone due to COVID-19 visitor restrictions. The session was facilitated by StratusSpanishinterpreters Elena #[id number] and Marcial #[id number].  Indication for genetic counseling - Silent carrier for alpha-thalassemia  Prenatal history  Natalie Snow is a G3P2002, 33 y.o. year old female. Her current pregnancy has completed [redacted]w[redacted]d (Estimated Date of Delivery: 04/29/19).  [name] denied exposure to environmental toxins or chemical agents. She denied the use of alcohol, tobacco or street drugs. She denied significant viral illnesses, fevers, and bleeding during the course of her pregnancy. Her medical and surgical histories were noncontributory.  Family History  A three generation pedigree was drafted and reviewed. The family history is remarkable for the following:  - Natalie Snow has a daughter with a previous partner who has Hemoglobin H disease (one form of alpha-thalassemia). This individual has anemia but has never required blood transfusions. We discussed that NatalieSnow's carrier screening results make sense in light of this information, as her daughter only had a chance of being affected by alpha-thalassemia if both Ms. Hartin and her daughter's father were carriers for the condition. Since the current pregnancy is with a new partner, it is recommended that he also undergo carrier screening to determine if he is a carrier for alpha-thalassemia, as the fetus in the current pregnancy only has a chance of being affected by Hemoglobin H disease if the father of this pregnancy is also a carrier. See Discussion section for more details.  - Natalie Snow has a history of thyroid issues  on her mother's side of the family. Natalie Snow's sister, mother, and several other maternal extended relatives have thyroid conditions. We discussed that many thyroid conditions are multifactorial in nature, occurring due to a combination of genetic, lifestyle, and environmental factors. However, thyroid conditions can appear to run in families, as we see in Natalie Snow's maternal side of the family. Since thyroid conditions are seen in multiple generations of Natalie Snow's family, [name] may have up to a 50% chance of having a thyroid disorder herself. There is also a chance that her children could also have thyroid dysfunction. It is recommended that Natalie Snow inform her own primary care physician and her children's pediatrician of this family history to monitor their thyroid function.  The remaining family histories were reviewed and found to be noncontributory for birth defects, intellectual disability, recurrent pregnancy loss, and known genetic conditions. Natalie Snow had limited information about her partner's family history; thus, precise risk assessment was limited.  The patient's ethnicity is Hispanic. The father of the pregnancy's ethnicity is Hispanic. Ashkenazi Jewish ancestry and consanguinity were denied. Pedigree will be scanned under Media.  Discussion  [name] had Horizon 14 carrier screening performed through [facility]. The results of the screen identified her as a silent carrier for alpha-thalassemia (aa/a-). Alpha-thalassemia is different in its inheritance compared to other hemoglobinopathies as there are two alpha globin genes on each chromosome 16, or four alpha globin genes total (aa/aa). A person can be a carrier of one alpha gene mutation (aa/a-), also referred to as a "silent carrier". A person who carries two alpha globin gene mutations can either carry them in cis (both on the same chromosome, denoted as aa/--) or    in trans (on different chromosomes, denoted as a-/a-).    There are several different forms of alpha-thalassemia. The most severe form of alpha-thalassemia, Hb Barts, is associated with an absence of alpha globin chain synthesis as a result of deletions of all four alpha globin genes (--/--).  Given that Natalie Snow is a silent carrier (aa/a-), her pregnancies would not be at increased risk for Hb Barts, even if her partner is a carrier for alpha-thalassemia. Hemoglobin H (HbH) disease is caused by three deleted or dysfunctioning alpha globin alleles (a-/--) and is characterized by microcytic hypochromic hemolytic anemia, hepatosplenomegaly, mild jaundice, and sometimes thalassemia-like bone changes. Given Natalie Snow's silent carrier status (aa/a-), the current pregnancy would only be at risk for HbH disease (a-/--), if her partner is a carrier for two alpha globin mutations in cis (aa/--). If this is the case, the risk for HbH disease in the pregnancy would be 1 in 4 (25%). If he is a carrier of alpha-thalassemia in trans, then the pregnancy would not be at increased risk for HbH disease. Based on the carrier frequency for alpha-thalassemia in the general population, Natalie Snow partner has a 1 in 25 chance of being any type of carrier for alpha-thalassemia.   Natalie Snow carrier screening was negative for the other 13 conditions screened. Thus, her risk to be a carrier for these additional conditions (listed separately in the laboratory report) has been reduced but not eliminated. We discussed that carrier scerening for alpha-thalassemia via CBC, hemoglobin electrophoresis, and ferritin analysis with reflex to molecular testing is recommended for Natalie Snow current partner. Ms. TRUE Shackleford indicated that she is interested in pursuing this.  We also reviewed that Natalie Snow had Panorama NIPS through [facility] that was low-risk for fetal aneuploidies.  We reviewed that these results showed a less than 1 in 10,000 risk for trisomies 21, 18 and 13, and monosomy X (Turner syndrome).  In addition, the risk for triploidy and sex chromosome trisomies (47,XXX and 47,XXY) was also low. Natalie Snow elected to have cffDNA analysis for 22q11 deletion syndrome, which was also low risk (1 in 2900). We reviewed that while this testing identifies > 99% of pregnancies with trisomy 79, trisomy 11, sex chromosome trisomies (47,XXX and 47,XXY), and triploidy, it is NOT diagnostic. A positive test result requires confirmation by CVS or amniocentesis, and a negative test result does not rule out a fetal chromosome abnormality. She also understands that this testing does not identify all genetic conditions.   Ms. Oriel Ojo was also counseled regarding the option of diagnostic testing via chorionic villus sampling (CVS) or amniocentesis . We discussed the technical aspects of each procedure and quoted up to a 1 in 500 (0.2%) risk for spontaneous pregnancy loss or other adverse pregnancy outcomes as a result of either procedure. Cultured cells from either a placental or amniotic fluid sample allow for the visualization of a fetal karyotype, which can detect >99% of chromosomal aberrations. Chromosomal microarray can also be performed to identify smaller deletions or duplications of fetal chromosomal material. CVS or amniocentesis could also be performed to assess whether the baby is affected by alpha-thalassemia. After careful consideration, Ms. Milanna Kozlov declined diagnostic testing at this time. She understands that diagnostic testing is available at any point through the end of pregnancy and that she may opt to undergo the procedure at a later date should she change her mind.  Lastly, the patient was made aware that screening for  open neural tube defects (ONTDs) via MS-AFP in the second trimester in addition to level II ultrasound examination is recommended.  Level II ultrasound and MS-AFP are able to detect ONTDs with 90-95% sensitivity. However, normal results from any of the above options do not guarantee a normal baby, as 3-5% of newborns have some type of birth defect, many of which are not prenatally diagnosable.  Ms. Stelly was interested in pursuing alpha-thalassemia carrier screening for her partner, Efrain Alonso. Given thatMr. Alonso is currently uninsured, we ordered carrier screening through the laboratory [facility]. We applied for the Patient Assistance Program for Mr. Alonso, as he qualifies for free testing. [facility] will mail a saliva kit to the family's home. The saliva kit will come with return packaging so that the family can mail the sample back to the laboratory at no cost. Results from carrier screening will be returned 10-21 days after the family ships the sample back to the laboratory. I will call Ms. Dapolito with the results when they become available.  I counseled Ms. Potenza regarding the above risks and available options. The approximate face-to-face time with the genetic counselor was 40 minutes.  In summary:  Discussed carrier screening results and options for follow-up testing ? Silent carrier for alpha-thalassemia ? Desires partner carrier screening for alpha-thalassemia. We will follow results  Reviewed negative NIPS results ? Reduction in risk for Down syndrome, trisomy 18, trisomy 13, sex chromosome aneuploidies, and 22q11.2 deletion syndrome  Offered additional testing and screening ? Declined CVS ? MS-AFP screening recommended  Reviewed family history concerns   Haley E Hill, MS Genetic Counselor 

## 2018-11-10 ENCOUNTER — Ambulatory Visit (HOSPITAL_COMMUNITY)
Admission: RE | Admit: 2018-11-10 | Discharge: 2018-11-10 | Disposition: A | Discharge status: Home / Self Care | Payer: BC Managed Care – PPO | Source: Ambulatory Visit | Attending: Family Medicine | Admitting: Family Medicine

## 2018-11-10 ENCOUNTER — Other Ambulatory Visit: Payer: Self-pay

## 2018-11-10 ENCOUNTER — Telehealth: Payer: Self-pay | Admitting: Lactation Services

## 2018-11-10 ENCOUNTER — Telehealth (HOSPITAL_COMMUNITY): Payer: Self-pay | Admitting: Genetic Counselor

## 2018-11-10 ENCOUNTER — Ambulatory Visit (INDEPENDENT_AMBULATORY_CARE_PROVIDER_SITE_OTHER): Payer: BC Managed Care – PPO | Admitting: Student

## 2018-11-10 ENCOUNTER — Encounter (HOSPITAL_COMMUNITY): Payer: Self-pay

## 2018-11-10 ENCOUNTER — Ambulatory Visit (HOSPITAL_COMMUNITY)
Admission: RE | Admit: 2018-11-10 | Discharge: 2018-11-10 | Disposition: A | Discharge status: Home / Self Care | Payer: BC Managed Care – PPO | Source: Ambulatory Visit | Attending: Student | Admitting: Student

## 2018-11-10 ENCOUNTER — Other Ambulatory Visit (HOSPITAL_COMMUNITY): Payer: Self-pay | Admitting: Student

## 2018-11-10 DIAGNOSIS — O209 Hemorrhage in early pregnancy, unspecified: Secondary | ICD-10-CM | POA: Insufficient documentation

## 2018-11-10 DIAGNOSIS — O021 Missed abortion: Secondary | ICD-10-CM

## 2018-11-10 DIAGNOSIS — O3680X0 Pregnancy with inconclusive fetal viability, not applicable or unspecified: Secondary | ICD-10-CM | POA: Insufficient documentation

## 2018-11-10 DIAGNOSIS — Z3A00 Weeks of gestation of pregnancy not specified: Secondary | ICD-10-CM | POA: Diagnosis not present

## 2018-11-10 DIAGNOSIS — O26851 Spotting complicating pregnancy, first trimester: Secondary | ICD-10-CM | POA: Diagnosis not present

## 2018-11-10 NOTE — Telephone Encounter (Signed)
Called pt with the assistance of Hondo, Spanish Interpreter to inform pt that we would like her to come in today at 2 pm for a nurse visit. Pt did not answer. LM on voice mail and sent My Chart message.

## 2018-11-10 NOTE — Progress Notes (Signed)
Pt here today for obtaining FHR due to pt's concern of change in bleeding.  Per [employer] R., pt reports that pt states that she is continuing to have the same amount of bleeding however it has changed to a bright red vaginal bleeding.  Attempted to obtain FHR via doppler unable to locate FHR.  Consulted with Diane Day, RNC who will attempt to scan the pt for FHR.

## 2018-11-10 NOTE — Progress Notes (Signed)
Informal bedside US performed for assessment of FHR due to pt c/o vaginal bleeding. IUP is not visualized. Pt will have STAT US in radiology today.

## 2018-11-10 NOTE — Telephone Encounter (Signed)
LVM for Ms. Castro-Gonzalez with help of Spanish interpret Antonio ([employer] [place] Intepreter ID [phone]), requesting her to print out the Patient Assistance Program form I had emailed her and have Efrain sign it, then email a photo of the signed copy back to me. This is necessary to be able to get her free partner carrier screening. Left my direct callback number should she have any questions.  Buelah Manis, MS Genetic Counselor

## 2018-11-10 NOTE — Progress Notes (Signed)
Patient Natalie Snow is a 32 y.o. [id number] Who is 15 weeks by early [place] here for nurse visit to check FHR after complaining of on-going vaginal bleeding. Denies any pain. Had [place] with cardiac activity on 6-30 and NOB visit on 7-11.   - She left a vmail on the nurse line complaining of on-going bleeding and was brought in to listen to [facility] (nurse visit).  -No FHR by Doppler, scan by Diane Day shows no IUP. Patient then went to MFM for scan. -Report delayed by [facility] Radiology; verbal summary of report given to [place] and communicated to me at 5:10pm.  -Discussed with patient that she has had a pregnancy loss, but that we need to check CBC and review final report before coming up with a plan.  -Blood type in previous labs is A positive.  -Patient agrees to come back tomorrow at 9:55 for follow up and discuss results and options.

## 2018-11-11 ENCOUNTER — Ambulatory Visit (INDEPENDENT_AMBULATORY_CARE_PROVIDER_SITE_OTHER): Payer: BC Managed Care – PPO | Admitting: Medical

## 2018-11-11 ENCOUNTER — Encounter: Payer: Self-pay | Admitting: Family Medicine

## 2018-11-11 ENCOUNTER — Encounter: Payer: Self-pay | Admitting: Medical

## 2018-11-11 VITALS — BP 126/83 | HR 101 | Temp 98.0°F | Ht 65.0 in | Wt 159.1 lb

## 2018-11-11 DIAGNOSIS — O021 Missed abortion: Secondary | ICD-10-CM

## 2018-11-11 NOTE — Progress Notes (Signed)
Light Bleeding

## 2018-11-11 NOTE — Progress Notes (Signed)
History:  [name] is a 33 y.o. [id number] who presents to clinic today for follow-up after ultrasound confirmed SAB yesterday. The patient's ultrasound results came back after 5pm. The provider was able to inform her of the miscarriage, but could not complete the visit due to time constraints. The patient states she is having light bleeding today. She also has occasional pain that is relieved with ibuprofen. She denies fever.    The following portions of the patient's history were reviewed and updated as appropriate: allergies, current medications, family history, past medical history, social history, past surgical history and problem list.  Review of Systems:  Review of Systems  Constitutional: Negative for fever.  Gastrointestinal: Positive for abdominal pain.  Genitourinary:       + vaginal bleeding      Objective:  Physical Exam BP 126/83   Pulse (!) 101   Temp 98 F (36.7 C)   Ht 5\' 5"  (1.651 m)   Wt 159 lb 1.6 oz (72.2 kg)   LMP 06/02/2018 Comment: no period in April, some bleeding 5/20 off and on a few days.  BMI 26.48 kg/m  Physical Exam  Nursing note and vitals reviewed. Constitutional: She is oriented to person, place, and time. She appears well-developed and well-nourished. No distress.  HENT:  Head: Normocephalic and atraumatic.  Cardiovascular: Normal rate.  Respiratory: Effort normal.  GI: Soft. She exhibits no distension and no mass. There is no abdominal tenderness. There is no rebound and no guarding.  Neurological: She is alert and oriented to person, place, and time.  Skin: Skin is warm and dry. No erythema.  Psychiatric: She has a normal mood and affect.    Labs and Imaging No results found for this or any previous visit (from the past 24 hour(s)).  US Ob Comp Less 14 Wks  Result Date: 11/10/2018 CLINICAL DATA:  Vaginal bleeding.  No audible Doppler heart tones. EXAM: OBSTETRIC <14 WK [place] AND TRANSVAGINAL OB US TECHNIQUE: Both  transabdominal and transvaginal ultrasound examinations were performed for complete evaluation of the gestation as well as the maternal uterus, adnexal regions, and pelvic cul-de-sac. Transvaginal technique was performed to assess early pregnancy. COMPARISON:  09/15/2018 FINDINGS: Intrauterine gestational sac: None; previously seen IUP is no longer visualized Maternal uterus/adnexae: Small amount of fluid noted in endometrial cavity, but no mass identified. Ovaries not directly visualized on this study, however no adnexal mass or abnormal free fluid identified. IMPRESSION: Previously seen IUP no longer visualized, consistent with interval spontaneous abortion. Small amount of fluid in endometrial cavity, but no definite evidence of retained products of conception. Electronically Signed   By: Marlaine Hind M.D.   On: 11/10/2018 17:03     Assessment & Plan:  1. Spontaneous abortion - CBC - Beta hCG quant (ref lab) - Given [place] results unlikely patient will need Cytotec or surgery  - Will follow hCG weekly until <5 - Warning signs for worsening condition discussed  - Patient advised to call our office for heavy bleeding, severe abdominal pain or fever - Patient counseled on when to present to [facility] for further evaluation    Luvenia Redden, PA-C 11/11/2018 10:29 AM

## 2018-11-11 NOTE — Patient Instructions (Signed)
Aborto espontneo incompleto Incomplete Miscarriage El aborto espontneo es la prdida de un beb que no ha nacido (feto) antes de la [id number] del Media planner. En un aborto espontneo incompleto, partes del feto o la placenta (alumbramiento) permanecen en el cuerpo. La mayor parte de los abortos espontneos ocurre durante los primeros 54meses de Media planner. En algunos casos, sucede antes de que la mujer sepa que est [place]ub[place]. El aborto espontneo puede ser Ardelia Mems experiencia que afecte emocionalmente a Geologist, engineering. Si ha sufrido un aborto espontneo, hable con su mdico para formularle las preguntas que tenga sobre el aborto, el proceso de duelo y los planes futuros de Media planner. Cules son las causas? Esta afeccin puede ser causada por lo siguiente:  Problemas genticos o cromosmicos que impiden que el beb se desarrolle con normalidad. Estos problemas son, en general, el resultado de errores fortuitos que ocurren en la etapa temprana del desarrollo y que no se transmiten de padres a hijos (no se heredan).  Infeccin en el cuello del tero.  Trastornos que afectan el equilibrio hormonal del organismo.  Problemas en el cuello del tero, como su adelgazamiento y apertura antes de que el embarazo llegue a trmino (insuficiencia del cuello de tero).  Problemas en el tero, como un tero con forma anmala o fibromas en el tero, o problemas existentes desde el nacimiento (anormalidades congnitas).  Ciertas enfermedades crnicas.  Fumar, beber alcohol o usar drogas.  Lesiones (traumatismos). Muchas veces la causa del aborto espontneo no se conoce. Cules son los signos o los sntomas? Los sntomas de esta afeccin incluyen los siguientes:  Sangrado o manchado vaginal, con o sin clicos o dolor.  Dolor o clicos en el abdomen o en la parte inferior de la espalda.  Eliminacin de lquido, tejidos o cogulos sanguneos por la vagina. Cmo se diagnostica? Esta afeccin se puede diagnosticar  en funcin de lo siguiente:  Un examen fsico.  Ecografa.  Anlisis de [place].  Anlisis de [place]. Cmo se trata? Un aborto espontneo incompleto se puede tratar con alguno de los siguientes mtodos:  Dilatacin y curetaje (D&C). Mediante este procedimiento, se expande el cuello del tero y se raspan las paredes (endometrio) para eliminar todo resto de tejido del Media planner.  Medicamentos, por ejemplo: ? Antibiticos para tratar una infeccin. ? Medicamentos para expulsar los restos de tejido del tero. ? Medicamentos para reducir Occupational psychologist) el tamao del tero. Estos medicamentos se pueden administrar si tiene un sangrado abundante. Si su factor sanguneo es Rhnegativo y [place] de su beb es Rhpositivo, usted Print production planner inyeccin del medicamento llamado inmunoglobulinaRhpara proteger a los bebs futuros de Best boy problemas con el factorsanguneoRh. Los trminos "Rhnegativo" y "Rhpositivo" Psychologist, prison and probation services a la presencia o no en la sangre de una protena especfica que se encuentra en la superficie de los glbulos rojos (factorRh). Siga estas indicaciones en su casa: Medicamentos   [employer] de venta libre y los recetados solamente como se lo haya indicado el mdico.  Si le recetaron antibiticos, tmelos como se lo haya indicado el mdico. No deje de tomar el antibitico aunque comience a sentirse mejor.  No tome antiinflamatorios no esteroideos (AINE), tales como aspirina e ibuprofeno, a menos que se lo indique el mdico. Estos medicamentos pueden provocarle sangrado. Actividad  Haga reposo segn lo indicado. Pregntele al mdico qu actividades son seguras para usted.  Pdale a alguien que la ayude con las responsabilidades familiares y del hogar durante este tiempo. Instrucciones generales  Lleve un registro de la cantidad y la saturacin  de las toallas higinicas que Landscape architectutiliza cada da. Anote esta informacin.  Anote la cantidad de tejido o cogulos  sanguneos que expulsa por la vagina. Guarde las cantidades grandes de tejidos para que el Qwest Communicationsmdico los examine.  No use tampones, no se haga duchas vaginales ni tenga relaciones sexuales hasta que el mdico la autorice.  Para que usted y su pareja puedan sobrellevar el proceso de duelo, hable con su mdico o busque apoyo psicolgico que los ayude a Runner, broadcasting/film/videoenfrentar la prdida del Psychiatristembarazo.  Cuando est lista, visite a su mdico para hablar sobre los Xcel Energypasos importantes que deber seguir en relacin con su salud y para tener un embarazo saludable en el futuro.  Concurra a todas las visitas de [address]se[address] como se lo haya indicado el mdico. Esto es importante. Dnde encontrar ms informacin  Colegio Estadounidense de Ethiopiabstetras y Insurance claims handlerGineclogos (American College of Obstetricians and Gynecologists): www.acog.org  Departamento de Salud y [address] CircleServicios Humanos de los [address]adosUnidos, Peruficina de Salud de Architectural technologistla Mujer (U.S. Department of Health and CarMaxHuman Services, Office on Pitney BowesWomen's Health): http://[email or web address]/www.womenshealth.gov Comunquese con un mdico si:  Tiene fiebre o siente escalofros.  Tiene una secrecin vaginal con mal olor. Solicite ayuda de inmediato si:  Siente calambres intensos o dolor en la espalda o en el abdomen.  Elimina cogulos sanguneos del tamao de una nuez (o ms grandes) o tejido por la vagina.  Tiene sangrado vaginal abundante y necesita ms de una toalla higinica de tamao regular por hora.  Se siente mareada o dbil.  Se desmaya.  Siente una tristeza que la invade o Archivistpiensa en lastimarse. Resumen  En un aborto espontneo incompleto, partes del feto o la placenta (alumbramiento) permanecen en el cuerpo.  Existen diversas opciones de tratamiento para un aborto espontneo incompleto; hable con su mdico sobre la ms [place]val[place] & Jan May[place] Islandsadecuada para usted.  Siga las indicaciones del mdico en cuanto a los cuidados posteriores.  Para que usted y su pareja puedan sobrellevar el proceso de duelo, hable con su mdico o  busque apoyo psicolgico que los ayude a Runner, broadcasting/film/videoenfrentar la prdida del Psychiatristembarazo. Esta informacin no tiene Theme park managercomo fin reemplazar el consejo del mdico. Asegrese de hacerle al mdico cualquier pregunta que tenga. Document Released: 03/04/2005 Document Revised: 12/05/2016 Document Reviewed: 12/05/2016 Elsevier Patient Education  2020 [employer]

## 2018-11-12 LAB — CBC
Hematocrit: 36.9 % (ref 34.0–46.6)
Hemoglobin: 12.1 g/dL (ref 11.1–15.9)
MCH: 28.3 pg (ref 26.6–33.0)
MCHC: 32.8 g/dL (ref 31.5–35.7)
MCV: 86 fL (ref 79–97)
Platelets: 305 10*3/uL (ref 150–450)
RBC: 4.28 x10E6/uL (ref 3.77–5.28)
RDW: 12.8 % (ref 11.7–15.4)
WBC: 9.1 10*3/uL (ref 3.4–10.8)

## 2018-11-12 LAB — BETA HCG QUANT (REF LAB): hCG Quant: 818 m[IU]/mL

## 2018-11-17 ENCOUNTER — Other Ambulatory Visit: Payer: Self-pay | Admitting: *Deleted

## 2018-11-17 DIAGNOSIS — O021 Missed abortion: Secondary | ICD-10-CM

## 2018-11-18 ENCOUNTER — Other Ambulatory Visit: Payer: BC Managed Care – PPO

## 2018-11-18 ENCOUNTER — Encounter: Payer: Self-pay | Admitting: *Deleted

## 2018-11-18 ENCOUNTER — Other Ambulatory Visit: Payer: Self-pay

## 2018-11-18 ENCOUNTER — Encounter (HOSPITAL_COMMUNITY): Payer: Self-pay | Admitting: *Deleted

## 2018-11-18 ENCOUNTER — Telehealth: Payer: Self-pay | Admitting: Family Medicine

## 2018-11-18 ENCOUNTER — Inpatient Hospital Stay (HOSPITAL_COMMUNITY)
Admission: AD | Admit: 2018-11-18 | Discharge: 2018-11-18 | Disposition: A | Discharge status: Home / Self Care | Payer: BC Managed Care – PPO | Attending: Family Medicine | Admitting: Family Medicine

## 2018-11-18 DIAGNOSIS — Z7982 Long term (current) use of aspirin: Secondary | ICD-10-CM | POA: Diagnosis not present

## 2018-11-18 DIAGNOSIS — O034 Incomplete spontaneous abortion without complication: Secondary | ICD-10-CM | POA: Insufficient documentation

## 2018-11-18 DIAGNOSIS — O34219 Maternal care for unspecified type scar from previous cesarean delivery: Secondary | ICD-10-CM

## 2018-11-18 DIAGNOSIS — O0990 Supervision of high risk pregnancy, unspecified, unspecified trimester: Secondary | ICD-10-CM

## 2018-11-18 DIAGNOSIS — O039 Complete or unspecified spontaneous abortion without complication: Secondary | ICD-10-CM | POA: Diagnosis not present

## 2018-11-18 DIAGNOSIS — O021 Missed abortion: Secondary | ICD-10-CM

## 2018-11-18 LAB — CBC
HCT: 26.7 % — ABNORMAL LOW (ref 36.0–46.0)
Hemoglobin: 8.9 g/dL — ABNORMAL LOW (ref 12.0–15.0)
MCH: 29.1 pg (ref 26.0–34.0)
MCHC: 33.3 g/dL (ref 30.0–36.0)
MCV: 87.3 fL (ref 80.0–100.0)
Platelets: 259 10*3/uL (ref 150–400)
RBC: 3.06 MIL/uL — ABNORMAL LOW (ref 3.87–5.11)
RDW: 13.2 % (ref 11.5–15.5)
WBC: 17.4 10*3/uL — ABNORMAL HIGH (ref 4.0–10.5)
nRBC: 0 % (ref 0.0–0.2)

## 2018-11-18 LAB — CBC WITH DIFFERENTIAL/PLATELET
Abs Immature Granulocytes: 0.09 10*3/uL — ABNORMAL HIGH (ref 0.00–0.07)
Basophils Absolute: 0 10*3/uL (ref 0.0–0.1)
Basophils Relative: 0 %
Eosinophils Absolute: 0 10*3/uL (ref 0.0–0.5)
Eosinophils Relative: 0 %
HCT: 30.5 % — ABNORMAL LOW (ref 36.0–46.0)
Hemoglobin: 9.8 g/dL — ABNORMAL LOW (ref 12.0–15.0)
Immature Granulocytes: 1 %
Lymphocytes Relative: 16 %
Lymphs Abs: 1.8 10*3/uL (ref 0.7–4.0)
MCH: 28.7 pg (ref 26.0–34.0)
MCHC: 32.1 g/dL (ref 30.0–36.0)
MCV: 89.4 fL (ref 80.0–100.0)
Monocytes Absolute: 0.6 10*3/uL (ref 0.1–1.0)
Monocytes Relative: 5 %
Neutro Abs: 8.5 10*3/uL — ABNORMAL HIGH (ref 1.7–7.7)
Neutrophils Relative %: 78 %
Platelets: 254 10*3/uL (ref 150–400)
RBC: 3.41 MIL/uL — ABNORMAL LOW (ref 3.87–5.11)
RDW: 13.2 % (ref 11.5–15.5)
WBC: 11 10*3/uL — ABNORMAL HIGH (ref 4.0–10.5)
nRBC: 0 % (ref 0.0–0.2)

## 2018-11-18 LAB — HCG, QUANTITATIVE, PREGNANCY: hCG, Beta Chain, Quant, S: 278 m[IU]/mL — ABNORMAL HIGH (ref <5)

## 2018-11-18 MED ORDER — HYDROMORPHONE HCL 1 MG/ML IJ SOLN
1.0000 mg | Freq: Once | INTRAMUSCULAR | Status: AC
  Administered 2018-11-18: 1 mg via INTRAVENOUS
  Filled 2018-11-18: qty 1

## 2018-11-18 MED ORDER — OXYCODONE-ACETAMINOPHEN 5-325 MG PO TABS: 1.0000 | ORAL_TABLET | Freq: Four times a day (QID) | ORAL | 0 refills | Status: DC | PRN

## 2018-11-18 MED ORDER — MISOPROSTOL 200 MCG PO TABS
800.0000 ug | ORAL_TABLET | Freq: Once | ORAL | Status: AC
  Administered 2018-11-18: 19:00:00 800 ug via BUCCAL
  Filled 2018-11-18: qty 4

## 2018-11-18 MED ORDER — MISOPROSTOL 200 MCG PO TABS: 200.0000 ug | ORAL_TABLET | Freq: Three times a day (TID) | ORAL | 0 refills | Status: DC | PRN

## 2018-11-18 MED ORDER — IBUPROFEN 800 MG PO TABS
800.0000 mg | ORAL_TABLET | Freq: Once | ORAL | Status: AC
  Administered 2018-11-18: 17:00:00 800 mg via ORAL
  Filled 2018-11-18: qty 1

## 2018-11-18 MED ORDER — LACTATED RINGERS IV BOLUS
1000.0000 mL | Freq: Once | INTRAVENOUS | Status: AC
  Administered 2018-11-18: 1000 mL via INTRAVENOUS

## 2018-11-18 NOTE — MAU Provider Note (Signed)
History     CSN: [id number]  Arrival date and time: 11/18/18 1509   First Provider Initiated Contact with Patient 11/18/18 1609      Chief Complaint  Patient presents with  . Vaginal Bleeding  . Abdominal Pain   HPI [name] is a 33 y.o. G3P2002 diagnosed with SAB last week who presents to MAU today with complaint of onset heavy vaginal bleeding 1 hour prior to arrival. She states that she had her routine weekly labs drawn this morning and her bleeding has been very minimal. She has used 3 pads in the last hour and is passing large clots. She has also had severe lower abdominal cramping rated at 8/10 now. She has not taken anything for pain today. She also had a syncopal episode at home when bleeding started. She felt dizzy and called out for her sister who found her on the floor. She denies pain or injury from that.   OB History as of 11/11/2018    Gravida  3   Para  2   Term  2   Preterm      AB      Living  2     SAB      TAB      Ectopic      Multiple      Live Births  2           Past Medical History:  Diagnosis Date  . Medical history non-contributory   . UTI (urinary tract infection)     Past Surgical History:  Procedure Laterality Date  . CESAREAN SECTION     x2   2004, 2010    Family History  Problem Relation Age of Onset  . Thyroid disease Mother   . Thyroid disease Sister     Social History   Tobacco Use  . Smoking status: Never Smoker  . Smokeless tobacco: Never Used  Substance Use Topics  . Alcohol use: No  . Drug use: Never    Allergies: No Known Allergies  Medications Prior to Admission  Medication Sig Dispense Refill Last Dose  . ibuprofen (ADVIL) 200 MG tablet Take 250 mg by mouth every 6 (six) hours as needed.   11/17/2018 at Unknown time  . AMBULATORY NON FORMULARY MEDICATION 1 Device by Other route once a week. Blood pressure cuff/Monitored regularly at home.  ICD 10: Z34.90 (Patient not taking: Reported  on 10/13/2018) 1 kit 0   . aspirin-acetaminophen-caffeine (EXCEDRIN MIGRAINE) 250-250-65 MG tablet Take 1 tablet by mouth daily as needed for migraine. 30 tablet 0   . Prenatal Vit-Fe Fumarate-FA (PRENATAL VITAMINS) 28-0.8 MG TABS Take 1 tablet by mouth daily. 30 tablet 12     Review of Systems  Constitutional: Negative for fever.  Gastrointestinal: Positive for abdominal pain. Negative for constipation, diarrhea, nausea and vomiting.  Genitourinary: Positive for vaginal bleeding. Negative for vaginal discharge.  Neurological: Positive for dizziness and syncope.   Physical Exam   Blood pressure (!) 121/59, pulse 76, temperature 98.2 F (36.8 C), resp. rate 12, last menstrual period 06/02/2018, SpO2 100 %, unknown if currently breastfeeding.  Physical Exam  Nursing note and vitals reviewed. Constitutional: She is oriented to person, place, and time. She appears well-developed and well-nourished. No distress.  HENT:  Head: Normocephalic and atraumatic.  Cardiovascular: Normal rate.  Respiratory: Effort normal.  GI: Soft. She exhibits no distension and no mass. There is abdominal tenderness (mild diffuse tenderness to palpation). There is no rebound  and no guarding.  Genitourinary:    Vaginal bleeding (Copious large clots removed from the vagina. When all clots removed only small blood from the cervical os) present.  There is bleeding (Copious large clots removed from the vagina. When all clots removed only small blood from the cervical os) in the vagina.  Neurological: She is alert and oriented to person, place, and time.  Skin: Skin is warm and dry. No erythema.  Psychiatric: She has a normal mood and affect.    Results for orders placed or performed during the hospital encounter of 11/18/18 (from the past 24 hour(s))  CBC with Differential/Platelet     Status: Abnormal   Collection Time: 11/18/18  4:25 PM  Result Value Ref Range   WBC 11.0 (H) 4.0 - 10.5 K/uL   RBC 3.41 (L) 3.87 -  5.11 MIL/uL   Hemoglobin 9.8 (L) 12.0 - 15.0 g/dL   HCT 30.5 (L) 36.0 - 46.0 %   MCV 89.4 80.0 - 100.0 fL   MCH 28.7 26.0 - 34.0 pg   MCHC 32.1 30.0 - 36.0 g/dL   RDW 13.2 11.5 - 15.5 %   Platelets 254 150 - 400 K/uL   nRBC 0.0 0.0 - 0.2 %   Neutrophils Relative % 78 %   Neutro Abs 8.5 (H) 1.7 - 7.7 K/uL   Lymphocytes Relative 16 %   Lymphs Abs 1.8 0.7 - 4.0 K/uL   Monocytes Relative 5 %   Monocytes Absolute 0.6 0.1 - 1.0 K/uL   Eosinophils Relative 0 %   Eosinophils Absolute 0.0 0.0 - 0.5 K/uL   Basophils Relative 0 %   Basophils Absolute 0.0 0.0 - 0.1 K/uL   Immature Granulocytes 1 %   Abs Immature Granulocytes 0.09 (H) 0.00 - 0.07 K/uL  hCG, quantitative, pregnancy     Status: Abnormal   Collection Time: 11/18/18  4:25 PM  Result Value Ref Range   hCG, Beta Chain, Quant, S 278 (H) <5 mIU/mL  CBC     Status: Abnormal   Collection Time: 11/18/18  7:46 PM  Result Value Ref Range   WBC 17.4 (H) 4.0 - 10.5 K/uL   RBC 3.06 (L) 3.87 - 5.11 MIL/uL   Hemoglobin 8.9 (L) 12.0 - 15.0 g/dL   HCT 26.7 (L) 36.0 - 46.0 %   MCV 87.3 80.0 - 100.0 fL   MCH 29.1 26.0 - 34.0 pg   MCHC 33.3 30.0 - 36.0 g/dL   RDW 13.2 11.5 - 15.5 %   Platelets 259 150 - 400 K/uL   nRBC 0.0 0.0 - 0.2 %     Orthostatic VS for the past 24 hrs (Last 3 readings):  BP- Lying Pulse- Lying BP- Sitting Pulse- Sitting BP- Standing at 0 minutes Pulse- Standing at 0 minutes  11/18/18 1634 106/62 86 102/56 93 119/63 103    MAU Course  Procedures None  MDM CBC, hCG today 800 mg Ibuprofen given  Orthostatic VS - normal, but patient had syncopal episode while waiting for standing value  EKG - normal  Discussed patient with Dr. Elonda Husky. Recommends 800 mcg buccal Cytotec and monitor bleeding. Recheck CBC when bleeding improves. RN checked bleeding q 30 minutes until 1930. Minimal bleeding on pad with each check. CBC repeated and Hgb 8.9 now. Dr. Elonda Husky ok with plan to discharge. Recommends Cytotec bucally TID x 3 days.    Assessment and Plan  A: Incomplete AB   P: Discharge home Rx for Cytotec and Percocet sent to patient's  pharmacy  Bleeding precautions discussed Patient advised to follow-up with [facility]  Patient may return to MAU as needed or if her condition were to change or worsen   Kerry Hough, PA-C 11/18/2018, 9:21 PM

## 2018-11-18 NOTE — MAU Note (Signed)
When obtaining orthostatic vital signs pt fainted, RN at bedside to stabilize pt and help pt back into bed. Pt did not fall. Provider Kerry Hough, PA made aware.

## 2018-11-18 NOTE — Discharge Instructions (Signed)
Aborto espontáneo °Miscarriage °El aborto espontáneo es la pérdida de un bebé que no ha nacido (feto) antes de la semana 20 del embarazo. °Siga estas indicaciones en su casa: °Medicamentos ° °· Tome los medicamentos de venta libre y los recetados solamente como se lo haya indicado el médico. °· Si le recetaron un antibiótico, tómelo como se lo haya indicado el médico. No deje de tomar los antibióticos aunque comience a sentirse mejor. °· No tome antiinflamatorios no esteroideos (AINE), a menos que el médico le diga que son seguros para usted. Estos incluyen aspirina e ibuprofeno. Estos medicamentos pueden provocarle sangrado. °Actividad °· Haga reposo según lo indicado. Pregúntele al médico qué actividades son seguras para usted. °· Pida ayuda para realizar las tareas de la casa durante este tiempo. °Instrucciones generales °· Anote cuántos apósitos usa por día y cuán saturados están. °· Observe la cantidad de tejido o grumos de sangre (coágulos de sangre) que expulsa por la vagina. Guarde las cantidades grandes de tejido para llevárselas al médico. °· No use tampones, no se haga duchas vaginales ni tenga relaciones sexuales hasta que el médico la autorice. °· Para que usted y su pareja puedan sobrellevar el proceso de duelo, hable con su médico o busque apoyo psicológico. °· Cuando esté lista, acuda al médico para hablar sobre los pasos que debe seguir para cuidar su salud. Además, hable con su médico sobre las medidas que debe adoptar para tener un embarazo saludable en el futuro. °· Concurra a todas las visitas de seguimiento como se lo haya indicado el médico. Esto es importante. °Comuníquese con un médico si: °· Tiene fiebre o siente escalofríos. °· Tiene una secreción vaginal con mal olor. °· Aumenta el sangrado. °Solicite ayuda de inmediato si: °· Tiene espasmos o dolor muy intensos en el abdomen o en la espalda. °· Elimina grumos de sangre por la vagina, que tienen el tamaño de una nuez o más. °· Elimina  tejido por la vagina, que tiene el tamaño de una nuez o más. °· Empapa más de un apósito de tamaño normal por hora. °· Se siente débil o mareada. °· Pierde el conocimiento (se desmaya). °· Siente tristeza que no se va o piensa en lastimarse. °Resumen °· El aborto espontáneo es la pérdida de un bebé que no ha nacido antes de la semana 20 del embarazo. °· Siga las indicaciones de su médico para el cuidado en su hogar. Concurra a todas las visitas de control. °· Para que usted y su pareja puedan sobrellevar el proceso de duelo, hable con su médico o busque apoyo psicológico. °Esta información no tiene como fin reemplazar el consejo del médico. Asegúrese de hacerle al médico cualquier pregunta que tenga. °Document Released: 09/03/2011 Document Revised: 12/09/2016 Document Reviewed: 12/09/2016 °Elsevier Patient Education © 2020 [employer] ° °

## 2018-11-18 NOTE — MAU Note (Addendum)
.   Natalie Snow is a 33 y.o. here in MAU reporting: that she is having a moderate amount of vaginal bleeding since she passed out and  fell at home today. States she had a confirmed SAB a few days ago and had repeat labs this morning.   Onset of complaint: Today Pain score: 8 Vitals:   11/18/18 1545  BP: 110/64  Resp: 16  Temp: (!) 97.4 F (36.3 C)  SpO2: 99%     FHT: Lab orders placed from triage:

## 2018-11-18 NOTE — Telephone Encounter (Signed)
Patient called in stating that she had blood work this morning and now she is having heavy bleeding and she passed out. Nicole instructed patient to go to MAU to be evaluated. Patient verbalized understanding

## 2018-11-19 LAB — BETA HCG QUANT (REF LAB): hCG Quant: 271 m[IU]/mL

## 2018-11-24 ENCOUNTER — Telehealth: Payer: Self-pay | Admitting: Student

## 2018-11-24 ENCOUNTER — Other Ambulatory Visit: Payer: Self-pay | Admitting: *Deleted

## 2018-11-24 DIAGNOSIS — O021 Missed abortion: Secondary | ICD-10-CM

## 2018-11-24 NOTE — Telephone Encounter (Signed)
Called the patient to inform of the upcoming visit and informed of our new location. Completed the covid19 screening for the patient, the patient answered no to all questions. Also informed of no children or visitors due to the covid19 restriction. Also informed of wearing a face mask upon entry. The patient verbalized understanding. °

## 2018-11-25 ENCOUNTER — Other Ambulatory Visit: Payer: Self-pay

## 2018-11-25 ENCOUNTER — Ambulatory Visit (INDEPENDENT_AMBULATORY_CARE_PROVIDER_SITE_OTHER): Payer: BC Managed Care – PPO | Admitting: Obstetrics & Gynecology

## 2018-11-25 ENCOUNTER — Other Ambulatory Visit: Payer: BC Managed Care – PPO

## 2018-11-25 ENCOUNTER — Encounter: Payer: Self-pay | Admitting: Obstetrics & Gynecology

## 2018-11-25 VITALS — BP 130/78 | HR 99 | Wt 155.1 lb

## 2018-11-25 DIAGNOSIS — O021 Missed abortion: Secondary | ICD-10-CM | POA: Diagnosis not present

## 2018-11-25 DIAGNOSIS — Z789 Other specified health status: Secondary | ICD-10-CM | POA: Diagnosis not present

## 2018-11-25 DIAGNOSIS — O039 Complete or unspecified spontaneous abortion without complication: Secondary | ICD-10-CM

## 2018-11-25 MED ORDER — NORETHINDRONE 0.35 MG PO TABS: 1.0000 | ORAL_TABLET | Freq: Every day | ORAL | 11 refills | Status: DC

## 2018-11-25 NOTE — Progress Notes (Signed)
   Subjective:    Patient ID: Natalie Snow, female    DOB: 1985-10-31, 33 y.o.   MRN: [id number]  HPI 33 yo married P4 here for a repeat QBHCG after a miscarriage. She also wants OCPs.  Review of Systems     Objective:   Physical Exam Breathing, conversing, and ambulating normally Well nourished, well hydrated Latina, no apparent distress  Live interpretor used for visit     Assessment & Plan:  Desires POPs because she gets migraines with combination OCPs micronor prescribed, rec start today and pelvic rest for 4 weeks Anemia- rec OTC iron BID x 2-3 weeks, then daily Rec MVI daily, especially important to help prevent ONTDs in future pregnancies Repeat QBHCG todasy

## 2018-11-26 LAB — BETA HCG QUANT (REF LAB): hCG Quant: 102 m[IU]/mL

## 2018-12-01 ENCOUNTER — Other Ambulatory Visit: Payer: Self-pay | Admitting: *Deleted

## 2018-12-01 DIAGNOSIS — O021 Missed abortion: Secondary | ICD-10-CM

## 2018-12-01 DIAGNOSIS — O0990 Supervision of high risk pregnancy, unspecified, unspecified trimester: Secondary | ICD-10-CM

## 2018-12-02 ENCOUNTER — Other Ambulatory Visit: Payer: Self-pay

## 2018-12-02 ENCOUNTER — Other Ambulatory Visit: Payer: BC Managed Care – PPO

## 2018-12-02 DIAGNOSIS — O021 Missed abortion: Secondary | ICD-10-CM

## 2018-12-03 LAB — BETA HCG QUANT (REF LAB): hCG Quant: 41 m[IU]/mL

## 2018-12-08 ENCOUNTER — Encounter: Payer: BC Managed Care – PPO | Admitting: Student

## 2018-12-16 ENCOUNTER — Ambulatory Visit (HOSPITAL_COMMUNITY): Payer: BC Managed Care – PPO

## 2018-12-24 ENCOUNTER — Telehealth (HOSPITAL_COMMUNITY): Payer: Self-pay | Admitting: Genetic Counselor

## 2018-12-24 NOTE — Telephone Encounter (Signed)
I called Ms. Zaeda Mcferran with the help of [place] [phone] to discuss her husband, Natalie Snow's negative carrier screening results for alpha-thalassemia. Mr. August Albino did not have any genetic changes in the genes associated with alpha-thalassemia (HBA1 and HBA2). This significantly reduces his risk to be a carrier of alpha-thalassemia, and thus the couple's chances of having a baby affected by alpha-thalassemia. Ms. Suzann Lazaro confirmed that she had no further questions at this time.   Buelah Manis, MS Genetic Counselor

## 2019-05-18 ENCOUNTER — Encounter: Payer: Self-pay | Admitting: Family Medicine

## 2019-05-18 ENCOUNTER — Other Ambulatory Visit: Payer: Self-pay

## 2019-05-18 ENCOUNTER — Ambulatory Visit (INDEPENDENT_AMBULATORY_CARE_PROVIDER_SITE_OTHER): Payer: Self-pay

## 2019-05-18 DIAGNOSIS — Z3201 Encounter for pregnancy test, result positive: Secondary | ICD-10-CM

## 2019-05-18 LAB — POCT PREGNANCY, URINE: Preg Test, Ur: POSITIVE — AB

## 2019-05-18 NOTE — Progress Notes (Addendum)
Pt here today for pregnancy test resulting positive.  With [employer] pt reports that she is having no vaginal bleeding with some mild cramps that are intermittent and can be on either side.  Pt reports that her LMP 03/01/19 making her 11w 1d today and EDD 12/06/19.  Medications/allergies reviewed.  List of medications safe to take in pregnancy given to pt.  Notified Nolene Bernheim, NP pt c/o intermittent cramping that pt states she has sometimes on the right side and then on the left side.  Provider recommendation to doppler for FHR to rule out ectopic.  Doppler FHR at 167.  Pt excited.  Pt advised that the front office will provide a proof of pregnancy letter.  Pt verbalized understanding.   Addison Naegeli, RN 05/18/19  Chart reviewed for nurse visit. Agree with plan of care.   Currie Paris, NP 05/18/2019 3:49 PM

## 2019-06-24 ENCOUNTER — Other Ambulatory Visit: Payer: Self-pay

## 2019-06-24 ENCOUNTER — Ambulatory Visit (INDEPENDENT_AMBULATORY_CARE_PROVIDER_SITE_OTHER): Payer: Self-pay | Admitting: *Deleted

## 2019-06-24 DIAGNOSIS — O0990 Supervision of high risk pregnancy, unspecified, unspecified trimester: Secondary | ICD-10-CM | POA: Insufficient documentation

## 2019-06-24 DIAGNOSIS — O09529 Supervision of elderly multigravida, unspecified trimester: Secondary | ICD-10-CM

## 2019-06-24 DIAGNOSIS — O09899 Supervision of other high risk pregnancies, unspecified trimester: Secondary | ICD-10-CM | POA: Insufficient documentation

## 2019-06-24 MED ORDER — PRENATAL 27-0.8 MG PO TABS: 1.0000 | ORAL_TABLET | Freq: Every day | ORAL | 0 refills | Status: DC

## 2019-06-24 NOTE — Progress Notes (Signed)
9:33 I called Geri with an Interpreter and left a message I was calling for her telephone visit and will call again in a few minutes-please be available.  Natalie Lindenbaum,RN  I connected with  Natalie Snow on 06/24/19 at  9:30 AM EDT by telephone and verified that I am speaking with the correct person using two identifiers.She confirmed her DOB is [birth year] not 09/26/1985 as noted .  She confirmed her address and pregnancy history.  I verified in her chart her drive[place] license confirms DOB 03-25-1985.  Discussed with registrar and asked Natalie Snow to bring current ID with her to her first appointment so that we  Can update her chart.    I discussed the limitations, risks, security and privacy concerns of performing an evaluation and management service by telephone and the availability of in person appointments. I also discussed with the patient that there may be a patient responsible charge related to this service. The patient expressed understanding and agreed to proceed.  I explained I am completing her New OB Intake today. We discussed Her EDD and that it is based on  sure LMP . I reviewed her allergies, meds, OB History, Medical /Surgical history, and appropriate screenings. I informed her of [facility] services.  I explained we will ask her to take her  blood pressure weekly .  I asked her to purchase a  blood pressure cuff since her insurance does not cover a RX. And asked her to bring it with her to her first ob appointment so we can show her how to use it. Explained  then we will have her take her blood pressure weekly and record and bring with her to her appointments. . I explained she will have some visits in office and some virtually. She already has Sports coach. I reviewed her new ob  appointment date/ time with her , our location and to wear mask, no visitors.  I explained she will have a pelvic exam, ob bloodwork, hemoglobin a1C, cbg , and  genetic testing if desired,- she does want a panorama. I scheduled an  [place] at 19 weeks and gave her the appointment.I also offered her genetic counseling due to her age - she declined.  She voices understanding.   Natalie Samaan,RN 06/24/2019  9:36 AM

## 2019-06-24 NOTE — Progress Notes (Signed)
Patient seen and assessed by nursing staff during this encounter. I have reviewed the chart and agree with the documentation and plan. I have also made any necessary editorial changes.  Joselyn Arrow, MD 06/24/2019 6:14 PM

## 2019-06-24 NOTE — Patient Instructions (Signed)

## 2019-07-06 ENCOUNTER — Other Ambulatory Visit (HOSPITAL_COMMUNITY)
Admission: RE | Admit: 2019-07-06 | Discharge: 2019-07-06 | Disposition: A | Discharge status: Home / Self Care | Payer: BLUE CROSS/BLUE SHIELD | Source: Ambulatory Visit | Attending: Family Medicine | Admitting: Family Medicine

## 2019-07-06 ENCOUNTER — Other Ambulatory Visit: Payer: Self-pay

## 2019-07-06 ENCOUNTER — Ambulatory Visit (INDEPENDENT_AMBULATORY_CARE_PROVIDER_SITE_OTHER): Payer: Self-pay | Admitting: Family Medicine

## 2019-07-06 ENCOUNTER — Encounter: Payer: Self-pay | Admitting: Family Medicine

## 2019-07-06 VITALS — BP 139/82 | HR 92 | Wt 170.8 lb

## 2019-07-06 DIAGNOSIS — O0990 Supervision of high risk pregnancy, unspecified, unspecified trimester: Secondary | ICD-10-CM | POA: Diagnosis not present

## 2019-07-06 DIAGNOSIS — O34219 Maternal care for unspecified type scar from previous cesarean delivery: Secondary | ICD-10-CM

## 2019-07-06 DIAGNOSIS — G43809 Other migraine, not intractable, without status migrainosus: Secondary | ICD-10-CM

## 2019-07-06 DIAGNOSIS — R1011 Right upper quadrant pain: Secondary | ICD-10-CM

## 2019-07-06 DIAGNOSIS — O09522 Supervision of elderly multigravida, second trimester: Secondary | ICD-10-CM

## 2019-07-06 MED ORDER — METOCLOPRAMIDE HCL 10 MG PO TABS: 10.0000 mg | ORAL_TABLET | Freq: Three times a day (TID) | ORAL | 2 refills | Status: DC | PRN

## 2019-07-06 MED ORDER — CYCLOBENZAPRINE HCL 10 MG PO TABS: 10.0000 mg | ORAL_TABLET | Freq: Three times a day (TID) | ORAL | 2 refills | Status: DC | PRN

## 2019-07-06 MED ORDER — MAGNESIUM OXIDE -MG SUPPLEMENT 200 MG PO TABS: 200.0000 mg | ORAL_TABLET | Freq: Every day | ORAL | 1 refills | Status: DC

## 2019-07-06 NOTE — Progress Notes (Signed)
History:   Natalie Snow is a 34 y.o. [id number] at [redacted]w[redacted]d by LMP being seen today for her first obstetrical visit.  Her obstetrical history is significant for AMA, 2 prior c-sections, migraines. Patient does intend to breast feed. Pregnancy history fully reviewed.  Patient reports some RUQ pain that radiates around her right side to her back. Pain is worse after eating and resolves several hours after eating. Does not occur daily but has worsened over past few weeks. Also reports that she has migraines and that migraines are worse in pregnancy. Sometimes migraines accompanied by nausea. She has tried Tylenol and Excedrin without relief. Averages 3-4 migraines weekly; improved with rest.       HISTORY: OB History  Gravida Para Term Preterm AB Living  4 2 2  0 1 2  SAB TAB Ectopic Multiple Live Births  1 0 0 0 2    # Outcome Date GA Lbr Len/2nd Weight Sex Delivery Anes PTL Lv  4 Current           3 SAB 10/2018 [redacted]w[redacted]d         2 Term 03/28/08 [redacted]w[redacted]d  8 lb 2 oz (3.685 kg) F CS-LTranv   LIV     Birth Comments: repeat c/s  1 Term 11/19/02 [redacted]w[redacted]d  8 lb 2 oz (3.685 kg) M CS-LTranv   LIV     Birth Comments: C/S NRFHR; pregnancy wnl     Complications: Fetal Intolerance    Last pap smear was done July 2020 and was normal  Past Medical History:  Diagnosis Date  . Medical history non-contributory   . UTI (urinary tract infection)    Past Surgical History:  Procedure Laterality Date  . CESAREAN SECTION     x2   2004, 2010   Family History  Problem Relation Age of Onset  . Thyroid disease Mother   . Thyroid disease Sister    Social History   Tobacco Use  . Smoking status: Never Smoker  . Smokeless tobacco: Never Used  Substance Use Topics  . Alcohol use: Not Currently    Comment: occasionally  . Drug use: Never   No Known Allergies Current Outpatient Medications on File Prior to Visit  Medication Sig Dispense Refill  . Prenatal Vit-Fe Fumarate-FA (MULTIVITAMIN-PRENATAL)  27-0.8 MG TABS tablet Take 1 tablet by mouth daily at 12 noon. 30 tablet 0  . Prenatal Vit-Fe Fumarate-FA (PRENATAL VITAMINS) 28-0.8 MG TABS Take 1 tablet by mouth daily. (Patient not taking: Reported on 07/06/2019) 30 tablet 12   No current facility-administered medications on file prior to visit.    Review of Systems Pertinent items noted in HPI and remainder of comprehensive ROS otherwise negative. Physical Exam:   Vitals:   07/06/19 1518 07/06/19 1530  BP: (!) 124/94 139/82  Pulse: 94 92  Weight: 170 lb 12.8 oz (77.5 kg)    Fetal Heart Rate (bpm): 137 CONSTITUTIONAL: Well-developed, well-nourished female in no acute distress.  HEENT:  Normocephalic, atraumatic. External right and left ear normal. No scleral icterus.  NECK: Normal range of motion SKIN: No rash noted. Not diaphoretic. No erythema. No pallor. MUSCULOSKELETAL: Normal range of motion. No edema noted. NEUROLOGIC: Alert and oriented to person, place, and time. Normal muscle tone coordination. No cranial nerve deficit noted. PSYCHIATRIC: Normal mood and affect. Normal behavior. Normal judgment and thought content. CARDIOVASCULAR: Normal heart rate noted RESPIRATORY: Effort  normal, no problems with respiration noted BREASTS: Symmetric in size. No masses, tenderness, skin changes, nipple drainage, or  lymphadenopathy bilaterally. Performed in the presence of a chaperone. ABDOMEN: Soft, Murphy's sign positive in RUQ otherwise non-tender abdomen PELVIC: Deferred   Assessment:    Pregnancy: [id number] Patient Active Problem List   Diagnosis Date Noted  . Supervision of high risk pregnancy, antepartum 06/24/2019  . Short interval between pregnancies affecting pregnancy, antepartum 06/24/2019  . Missed abortion 11/11/2018  . Alpha thalassemia silent carrier 10/27/2018  . Migraines 10/13/2018  . History of cesarean delivery, currently pregnant 10/08/2018  . Advanced maternal age in multigravida 10/08/2018     Plan:   Natalie Snow was seen today for routine prenatal visit.  Diagnoses and all orders for this visit:  Supervision of high risk pregnancy, antepartum -     Culture, OB Urine -     Genetic Screening -     Obstetric Panel, Including HIV -     Hemoglobin A1c -     Cervicovaginal ancillary only( [place]) -     Protein / creatinine ratio, urine -     Comprehensive metabolic panel - RTC in 4 weeks - First DBP elevated, cont to monitor for cHTN; denies hx of Pre-E or cHTN  History of cesarean delivery, currently pregnant       - Plan for repeat C-section with BTL  Multigravida of advanced maternal age in second trimester  Other migraine without status migrainosus, not intractable -     Magnesium Oxide 200 MG TABS; Take 1 tablet (200 mg total) by mouth daily. -     cyclobenzaprine (FLEXERIL) 10 MG tablet; Take 1 tablet (10 mg total) by mouth 3 (three) times daily as needed for muscle spasms (Headache). -     metoCLOPramide (REGLAN) 10 MG tablet; Take 1 tablet (10 mg total) by mouth every 8 (eight) hours as needed for nausea or vomiting (Headache). - Hx of migraines. Discussed various methods and encouraged increased water intake. Advised daily Mag Ox along with PRN Reglan or Flexeril. Could add on Fioricet PRN.   RUQ pain -     US ABDOMEN LIMITED RUQ; Future   Initial labs drawn. Continue prenatal vitamins. Genetic Screening discussed, NIPS: ordered. Ultrasound discussed; fetal anatomic survey: previously ordered and scheduled for 4/26 Problem list reviewed and updated. The nature of [facility] with multiple MDs and other Advanced Practice Providers was explained to patient; also emphasized that residents, students are part of our team. Routine obstetric precautions reviewed. Return in about 4 weeks (around 08/03/2019) for [facility]; in-person .    Jerilynn Birkenhead, MD [facility] Family Medicine Fellow, [facility] for [employer], [facility]

## 2019-07-06 NOTE — Patient Instructions (Signed)
Segundo trimestre de embarazo Second Trimester of Pregnancy  El segundo trimestre va desde la semana14 hasta la 27 (desde el mes 4 hasta el 6). Este suele ser el momento en el que mejor se siente. En general, las nuseas matutinas han disminuido o han desaparecido completamente. Tendr ms energa y podr aumentarle el apetito. El beb en gestacin se desarrolla rpidamente. Hacia el final del sexto mes, el beb mide aproximadamente 9 pulgadas (23 cm) y pesa alrededor de 1 libras (700 g). Es probable que sienta al beb moverse entre las 18 y 20 semanas del embarazo. Siga estas indicaciones en su casa: Medicamentos  Tome los medicamentos de venta libre y los recetados solamente como se lo haya indicado el mdico. Algunos medicamentos son seguros para tomar durante el embarazo y otros no lo son.  Tome vitaminas prenatales que contengan por lo menos 600microgramos (?g) de cido flico.  Si tiene dificultad para mover el intestino (estreimiento), tome un medicamento para ablandar las heces (laxante) si su mdico se lo autoriza. Comida y bebida   Ingiera alimentos saludables de manera regular.  No coma carne cruda ni quesos sin cocinar.  Si obtiene poca cantidad de calcio de los alimentos que ingiere, consulte a su mdico sobre la posibilidad de tomar un suplemento diario de calcio.  Evite el consumo de alimentos ricos en grasas y azcares, como los alimentos fritos y los dulces.  Si tiene malestar estomacal (nuseas) o devuelve (vomita): ? Ingiera 4 o 5comidas pequeas por da en lugar de 3abundantes. ? Intente comer algunas galletitas saladas. ? Beba lquidos entre las comidas, en lugar de hacerlo durante estas.  Para evitar el estreimiento: ? Consuma alimentos ricos en fibra, como frutas y verduras frescas, cereales integrales y frijoles. ? Beba suficiente lquido para mantener el pis (orina) claro o de color amarillo plido. Actividad  Haga ejercicios solamente como se lo haya  indicado el mdico. Interrumpa la actividad fsica si comienza a tener calambres.  No haga ejercicio si hace demasiado calor, hay demasiada humedad o se encuentra en un lugar de mucha altura (altitud alta).  Evite levantar pesos excesivos.  Use zapatos con tacones bajos. Mantenga una buena postura al sentarse y pararse.  Puede continuar teniendo relaciones sexuales, a menos que el mdico le indique lo contrario. Alivio del dolor y del malestar  Use un sostn que le brinde buen soporte si sus mamas estn sensibles.  Dese baos de asiento con agua tibia para aliviar el dolor o las molestias causadas por las hemorroides. Use una crema para las hemorroides si el mdico la autoriza.  Descanse con las piernas elevadas si tiene calambres o dolor de cintura.  Si desarrolla venas hinchadas y abultadas (vrices) en las piernas: ? Use medias de compresin o medias de descanso como se lo haya indicado el mdico. ? Levante (eleve) los pies durante 15minutos, 3 o 4veces por da. ? Limite el consumo de sal en sus alimentos. Cuidado prenatal  Escriba sus preguntas. Llvelas cuando concurra a las visitas prenatales.  Concurra a todas las visitas prenatales como se lo haya indicado el mdico. Esto es importante. Seguridad  Colquese el cinturn de seguridad cuando conduzca.  Haga una lista de los nmeros de telfono de emergencia, que incluya los nmeros de telfono de familiares, amigos, el hospital, as como los departamentos de polica y bomberos. Instrucciones generales  Consulte a su mdico sobre los alimentos que debe comer o pdale que la ayude a encontrar a quien pueda aconsejarla si necesita ese servicio.    Consulte a su mdico acerca de dnde se dictan clases prenatales cerca de donde vive. Comience las clases antes del mes 6 de embarazo.  No se d baos de inmersin en agua caliente, baos turcos ni saunas.  No se haga duchas vaginales ni use tampones o toallas higinicas perfumadas.   No mantenga las piernas cruzadas durante mucho tiempo.  Vaya al dentista si an no lo hizo. Use un cepillo de cerdas suaves para cepillarse los dientes. Psese el hilo dental suavemente.  No fume, no consuma hierbas ni beba alcohol. No tome frmacos que el mdico no haya autorizado.  No consuma ningn producto que contenga nicotina o tabaco, como cigarrillos y cigarrillos electrnicos. Si necesita ayuda para dejar de fumar, consulte al mdico.  Evite el contacto con las bandejas sanitarias de los gatos y la tierra que estos animales usan. Estos elementos contienen bacterias que pueden causar defectos congnitos al beb y la posible prdida del beb (aborto espontneo) o la muerte fetal. Comunquese con un mdico si:  Tiene clicos leves o siente presin en la parte baja del vientre.  Tiene dolor al hacer pis (orinar).  Advierte un lquido con olor ftido que proviene de la vagina.  Tiene malestar estomacal (nuseas), devuelve (vomita) o tiene deposiciones acuosas (diarrea).  Sufre un dolor persistente en el abdomen.  Siente mareos. Solicite ayuda de inmediato si:  Tiene fiebre.  Tiene una prdida de lquido por la vagina.  Tiene sangrado o pequeas prdidas vaginales.  Siente dolor intenso o clicos en el abdomen.  Sube o baja de peso rpidamente.  Tiene dificultades para recuperar el aliento y siente dolor en el pecho.  Sbitamente se le hinchan mucho el rostro, las manos, los tobillos, los pies o las piernas.  No ha sentido los movimientos del beb durante una hora.  Siente un dolor de cabeza intenso que no se alivia al tomar medicamentos.  Tiene dificultad para ver. Resumen  El segundo trimestre va desde la semana14 hasta la 27, desde el mes 4 hasta el 6. Este suele ser el momento en el que mejor se siente.  Para cuidarse y cuidar a su beb en gestacin, debe comer alimentos saludables, tomar medicamentos solamente si su mdico le indica que lo haga y hacer  actividades que sean seguras para usted y su beb.  Llame al mdico si se enferma o si nota algo inusual acerca de su embarazo. Tambin llame al mdico si necesita ayuda para saber qu alimentos debe comer o si quiere saber qu actividades puede realizar de forma segura. Esta informacin no tiene como fin reemplazar el consejo del mdico. Asegrese de hacerle al mdico cualquier pregunta que tenga. Document Revised: 11/27/2016 Document Reviewed: 11/27/2016 Elsevier Patient Education  2020 Elsevier Inc.  

## 2019-07-06 NOTE — Progress Notes (Signed)
Pt states since last week has been having pain under her right rib towards back.

## 2019-07-07 LAB — OBSTETRIC PANEL, INCLUDING HIV
Antibody Screen: NEGATIVE
Basophils Absolute: 0 10*3/uL (ref 0.0–0.2)
Basos: 0 %
EOS (ABSOLUTE): 0 10*3/uL (ref 0.0–0.4)
Eos: 0 %
HIV Screen 4th Generation wRfx: NONREACTIVE
Hematocrit: 34.3 % (ref 34.0–46.6)
Hemoglobin: 11.2 g/dL (ref 11.1–15.9)
Hepatitis B Surface Ag: NEGATIVE
Immature Grans (Abs): 0.1 10*3/uL (ref 0.0–0.1)
Immature Granulocytes: 1 %
Lymphocytes Absolute: 2.1 10*3/uL (ref 0.7–3.1)
Lymphs: 27 %
MCH: 26.7 pg (ref 26.6–33.0)
MCHC: 32.7 g/dL (ref 31.5–35.7)
MCV: 82 fL (ref 79–97)
Monocytes Absolute: 0.4 10*3/uL (ref 0.1–0.9)
Monocytes: 6 %
Neutrophils Absolute: 5 10*3/uL (ref 1.4–7.0)
Neutrophils: 66 %
Platelets: 272 10*3/uL (ref 150–450)
RBC: 4.19 x10E6/uL (ref 3.77–5.28)
RDW: 18.5 % — ABNORMAL HIGH (ref 11.7–15.4)
RPR Ser Ql: NONREACTIVE
Rh Factor: POSITIVE
Rubella Antibodies, IGG: 7.83 index (ref >0.99)
WBC: 7.6 10*3/uL (ref 3.4–10.8)

## 2019-07-07 LAB — COMPREHENSIVE METABOLIC PANEL
ALT: 13 IU/L (ref 0–32)
AST: 16 IU/L (ref 0–40)
Albumin/Globulin Ratio: 1.3 (ref 1.2–2.2)
Albumin: 3.8 g/dL (ref 3.8–4.8)
Alkaline Phosphatase: 63 IU/L (ref 39–117)
BUN/Creatinine Ratio: 16 (ref 9–23)
BUN: 8 mg/dL (ref 6–20)
Bilirubin Total: <0.2 mg/dL (ref 0.0–1.2)
CO2: 20 mmol/L (ref 20–29)
Calcium: 9.2 mg/dL (ref 8.7–10.2)
Chloride: 100 mmol/L (ref 96–106)
Creatinine, Ser: 0.5 mg/dL — ABNORMAL LOW (ref 0.57–1.00)
GFR calc Af Amer: 145 mL/min/{1.73_m2} (ref >59)
GFR calc non Af Amer: 126 mL/min/{1.73_m2} (ref >59)
Globulin, Total: 2.9 g/dL (ref 1.5–4.5)
Glucose: 102 mg/dL — ABNORMAL HIGH (ref 65–99)
Potassium: 3.7 mmol/L (ref 3.5–5.2)
Sodium: 132 mmol/L — ABNORMAL LOW (ref 134–144)
Total Protein: 6.7 g/dL (ref 6.0–8.5)

## 2019-07-07 LAB — HEMOGLOBIN A1C
Est. average glucose Bld gHb Est-mCnc: 108 mg/dL
Hgb A1c MFr Bld: 5.4 % (ref 4.8–5.6)

## 2019-07-07 LAB — PROTEIN / CREATININE RATIO, URINE
Creatinine, Urine: 101.2 mg/dL
Protein, Ur: 9.5 mg/dL
Protein/Creat Ratio: 94 mg/g creat (ref 0–200)

## 2019-07-08 LAB — CERVICOVAGINAL ANCILLARY ONLY
Chlamydia: NEGATIVE
Comment: NEGATIVE
Comment: NORMAL
Neisseria Gonorrhea: NEGATIVE

## 2019-07-08 LAB — CULTURE, OB URINE

## 2019-07-08 LAB — URINE CULTURE, OB REFLEX

## 2019-07-12 ENCOUNTER — Other Ambulatory Visit: Payer: Self-pay

## 2019-07-12 ENCOUNTER — Other Ambulatory Visit: Payer: Self-pay | Admitting: Family Medicine

## 2019-07-12 ENCOUNTER — Ambulatory Visit (HOSPITAL_COMMUNITY): Payer: BLUE CROSS/BLUE SHIELD | Admitting: *Deleted

## 2019-07-12 ENCOUNTER — Ambulatory Visit (HOSPITAL_COMMUNITY)
Admission: RE | Admit: 2019-07-12 | Discharge: 2019-07-12 | Disposition: A | Discharge status: Home / Self Care | Payer: BLUE CROSS/BLUE SHIELD | Source: Ambulatory Visit | Attending: Obstetrics and Gynecology | Admitting: Obstetrics and Gynecology

## 2019-07-12 ENCOUNTER — Other Ambulatory Visit (HOSPITAL_COMMUNITY): Payer: Self-pay | Admitting: *Deleted

## 2019-07-12 ENCOUNTER — Encounter (HOSPITAL_COMMUNITY): Payer: Self-pay

## 2019-07-12 VITALS — BP 133/78 | HR 96 | Temp 97.5°F

## 2019-07-12 DIAGNOSIS — O09529 Supervision of elderly multigravida, unspecified trimester: Secondary | ICD-10-CM

## 2019-07-12 DIAGNOSIS — O09899 Supervision of other high risk pregnancies, unspecified trimester: Secondary | ICD-10-CM | POA: Diagnosis not present

## 2019-07-12 DIAGNOSIS — O09522 Supervision of elderly multigravida, second trimester: Secondary | ICD-10-CM | POA: Insufficient documentation

## 2019-07-12 DIAGNOSIS — Z148 Genetic carrier of other disease: Secondary | ICD-10-CM

## 2019-07-12 DIAGNOSIS — O10012 Pre-existing essential hypertension complicating pregnancy, second trimester: Secondary | ICD-10-CM

## 2019-07-12 DIAGNOSIS — Z3A17 17 weeks gestation of pregnancy: Secondary | ICD-10-CM

## 2019-07-12 DIAGNOSIS — O34219 Maternal care for unspecified type scar from previous cesarean delivery: Secondary | ICD-10-CM

## 2019-07-12 DIAGNOSIS — O0990 Supervision of high risk pregnancy, unspecified, unspecified trimester: Secondary | ICD-10-CM | POA: Diagnosis not present

## 2019-07-12 DIAGNOSIS — Z362 Encounter for other antenatal screening follow-up: Secondary | ICD-10-CM

## 2019-07-12 DIAGNOSIS — Z363 Encounter for antenatal screening for malformations: Secondary | ICD-10-CM

## 2019-07-20 ENCOUNTER — Encounter: Payer: Self-pay | Admitting: *Deleted

## 2019-07-26 ENCOUNTER — Encounter: Payer: Self-pay | Admitting: *Deleted

## 2019-07-28 ENCOUNTER — Other Ambulatory Visit: Payer: Self-pay

## 2019-07-28 ENCOUNTER — Inpatient Hospital Stay (HOSPITAL_COMMUNITY)
Admission: AD | Admit: 2019-07-28 | Discharge: 2019-07-28 | Disposition: A | Discharge status: Home / Self Care | Payer: BLUE CROSS/BLUE SHIELD | Attending: Obstetrics & Gynecology | Admitting: Obstetrics & Gynecology

## 2019-07-28 ENCOUNTER — Encounter (HOSPITAL_COMMUNITY): Payer: Self-pay | Admitting: Obstetrics & Gynecology

## 2019-07-28 ENCOUNTER — Ambulatory Visit (INDEPENDENT_AMBULATORY_CARE_PROVIDER_SITE_OTHER): Payer: BLUE CROSS/BLUE SHIELD | Admitting: Women's Health

## 2019-07-28 ENCOUNTER — Inpatient Hospital Stay (HOSPITAL_COMMUNITY): Payer: BLUE CROSS/BLUE SHIELD

## 2019-07-28 VITALS — BP 137/77 | HR 120

## 2019-07-28 DIAGNOSIS — Z3A21 21 weeks gestation of pregnancy: Secondary | ICD-10-CM | POA: Diagnosis not present

## 2019-07-28 DIAGNOSIS — O99612 Diseases of the digestive system complicating pregnancy, second trimester: Secondary | ICD-10-CM | POA: Diagnosis not present

## 2019-07-28 DIAGNOSIS — O26892 Other specified pregnancy related conditions, second trimester: Secondary | ICD-10-CM

## 2019-07-28 DIAGNOSIS — K219 Gastro-esophageal reflux disease without esophagitis: Secondary | ICD-10-CM | POA: Diagnosis not present

## 2019-07-28 DIAGNOSIS — O09899 Supervision of other high risk pregnancies, unspecified trimester: Secondary | ICD-10-CM

## 2019-07-28 DIAGNOSIS — O34219 Maternal care for unspecified type scar from previous cesarean delivery: Secondary | ICD-10-CM

## 2019-07-28 DIAGNOSIS — R1010 Upper abdominal pain, unspecified: Secondary | ICD-10-CM

## 2019-07-28 DIAGNOSIS — R1011 Right upper quadrant pain: Secondary | ICD-10-CM | POA: Diagnosis not present

## 2019-07-28 DIAGNOSIS — O09522 Supervision of elderly multigravida, second trimester: Secondary | ICD-10-CM

## 2019-07-28 DIAGNOSIS — O365990 Maternal care for other known or suspected poor fetal growth, unspecified trimester, not applicable or unspecified: Secondary | ICD-10-CM

## 2019-07-28 DIAGNOSIS — O09892 Supervision of other high risk pregnancies, second trimester: Secondary | ICD-10-CM

## 2019-07-28 DIAGNOSIS — O0992 Supervision of high risk pregnancy, unspecified, second trimester: Secondary | ICD-10-CM

## 2019-07-28 DIAGNOSIS — O0990 Supervision of high risk pregnancy, unspecified, unspecified trimester: Secondary | ICD-10-CM

## 2019-07-28 DIAGNOSIS — D563 Thalassemia minor: Secondary | ICD-10-CM

## 2019-07-28 MED ORDER — OMEPRAZOLE 20 MG PO CPDR: 20.0000 mg | DELAYED_RELEASE_CAPSULE | Freq: Two times a day (BID) | ORAL | 6 refills | Status: DC

## 2019-07-28 NOTE — Patient Instructions (Addendum)
Las medicinas seguras para tomar Academic librarian  Safe Medications in Pregnancy  Acn:  Benzoyl Peroxide (Perxido de benzolo)  Salicylic Acid (cido saliclico)  Dolor de espalda/Dolor de cabeza:  Tylenol: 2 pastillas de concentracin regular cada 4 horas O 2 pastillas de concentracin fuerte cada 6 horas  Resfriados/Tos/Alergias:  Benadryl (sin alcohol) 25 mg cada 6 horas segn lo necesite Breath Right strips (Tiras para respirar correctamente)  Claritin  Cepacol (pastillas de chupar para la garganta)  Chloraseptic (aerosol para la garganta)  Cold-Eeze- hasta tres veces por da  Cough drops (pastillas de chupar para la tos, sin alcohol)  Flonase (con receta mdica solamente)  Guaifenesin  Mucinex  Robitussin DM (simple solamente, sin alcohol)  Saline nasal spray/drops (Aerosol nasal salino/gotas) Sudafed (pseudoephedrine) y  Actifed * utilizar slo despus de 12 semanas de gestacin y si no tiene la presin arterial alta.  Tylenol Vicks  VapoRub  Zinc lozenges (pastillas para la garganta)  Zyrtec  Estreimiento:  Colace  Ducolax (supositorios)  Fleet enema (lavado intestinal rectal)  Glycerin (supositorios)  Metamucil  Milk of magnesia (leche de magnesia)  Miralax  Senokot  Smooth Move (t)  Diarrea:  Kaopectate Imodium A-D  *NO tome Pepto-Bismol  Hemorroides:  Anusol  Anusol HC  Preparation H  Tucks  Indigestin:  Tums  Maalox  Mylanta  Zantac  Pepcid  Insomnia:  Benadryl (sin alcohol) 25mg  cada 6 horas segn lo necesite  Tylenol PM  Unisom, no Gelcaps  Calambres en las piernas:  Tums  MagGel Nuseas/Vmitos:  Bonine  Dramamine  Emetrol  Ginger (extracto)  Sea-Bands  Meclizine  Medicina para las nuseas que puede tomar durante el embarazo: Unisom (doxylamine succinate, pastillas de 25 mg) Tome una pastilla al da al [place]. Si los sntomas no estn adecuadamente controlados, la dosis puede aumentarse hasta una dosis mxima recomendada de [place] al da (1/2 pastilla por la [place], 1/2 pastilla a media tarde y [place] pastilla al [place]). Pastillas de Vitamina B6 de 100mg . Tome [place] veces al da (hasta 200 mg por da).  Erupciones en la piel:  Productos de Aveeno  Benadryl cream (crema o una dosis de 25mg  cada 6 horas segn lo necesite)  Calamine Lotion (locin)  1% cortisone cream (crema de cortisona de 1%)  nfeccin vaginal por hongos (candidiasis):  Gyne-lotrimin 7  Monistat 7   **Si est tomando varias medicinas, por favor revise las etiquetas para los mismos ingredientes [place]. **Tome la medicina segn lo indicado en la etiqueta. **No tome ms de 400 mg de Tylenol en 24 horas. **No tome medicinas que contengan aspirina o ibuprofeno.         Informacin sobre parto y [place] de parto prematuros (Preterm Labor and Birth Information) La duracin de un embarazo normal es de 39 a 41semanas. Se llama trabajo de parto prematuro cuando se inicia antes de las 37semanas de [place]. CULES SON LOS FACTORES DE RIESGO DEL TRABAJO DE PARTO PREMATURO? Existen mayores probabilidades de trabajo de parto prematuro en mujeres con las siguientes caractersticas:  Tienen ciertas infecciones durante el embarazo, como infeccin de vejiga, infeccin de transmisin sexual o infeccin en el tero (corioamnionitis).  Tienen el cuello del tero ms corto que lo normal.  Tuvieron trabajo de parto prematuro anteriormente.  Se sometieron a una ciruga en el cuello del tero.  Son menores de 17aos o [place] de 35aos de edad.  Son afroamericanas.  Estn embarazadas de [place] o de varios bebs (gestacin mltiple).  Consumen drogas o fuman mientras estn embarazadas.  No aumentan de peso lo suficiente durante el [employer].  Se embarazan poco despus de [employer] CULES SON LOS SNTOMAS DEL [place]? Los sntomas del trabajo de parto prematuro incluyen lo  siguiente:  Marketing executive similares a los que ocurren durante el perodo menstrual. Los calambres pueden presentarse con diarrea.  Dolor en el abdomen o en la parte inferior de la espalda.  Contracciones uterinas regulares que se pueden sentir como una presin en el abdomen.  Una sensacin de mayor presin en la pelvis.  Aumento de la secrecin de moco acuoso o sanguinolento en la vagina.  Rotura de bolsa (rotura de saco amnitico). POR QU ES IMPORTANTE RECONOCER LOS SIGNOS DEL [place]? Es Glass blower/designer los signos del trabajo de parto prematuro porque los bebs que nacen de forma prematura pueden no estar completamente desarrollados. Por lo tanto, pueden correr mayor riesgo de lo siguiente:  Problemas cardacos y pulmonares a Barrister's clerk (crnicos).  Inmediatamente despus del parto, dificultades para regular los sistemas corporales, que incluyen glucemia, temperatura corporal, frecuencia cardaca y frecuencia respiratoria.  Hemorragia cerebral.  Parlisis cerebral.  Dificultades en el aprendizaje.  Muerte. Estos riesgos son [employer] para bebs que nacen antes de las 34semanas de [place]. [place]? El tratamiento depende del tiempo de su [place], su afeccin y la salud de su beb. Puede incluir lo siguiente:  Tener un punto (sutura) en el cuello del tero para evitar que este se abra demasiado pronto (cerclaje).  Tomar medicamentos, por ejemplo: ? Medicamentos hormonales. Estos se pueden administrar de forma temprana en el embarazo para ayudar a Comptroller. ? [place] contracciones. ? Medicamentos que ayudan a [employer] del beb. Estos se pueden recetar si el riesgo de parto es [place]. ? Medicamentos para evitar que el beb desarrolle parlisis cerebral. Si el trabajo de parto de inicia antes de las 34semanas de [place], es posible que deba hospitalizarse. QU DEBO  HACER SI CREO QUE ESTOY EN TRABAJO DE [place]? Si cree que est iniciando trabajo de parto prematuro, llame al mdico de inmediato. [place] DE PARTO PREMATURO EN FUTUROS EMBARAZOS? Para aumentar las probabilidades de tener un embarazo a trmino, Dance movement psychotherapist en cuenta lo siguiente:  No consuma ningn producto que contenga tabaco, lo que incluye cigarrillos, tabaco de Higher education careers adviser y Psychologist, sport and exercise. Si necesita ayuda para dejar de fumar, consulte al mdico.  No consuma drogas ni medicamentos que no sean recetados Solicitor.  Hable con el mdico antes de tomar suplementos a base de hierbas aunque los [employer].  Asegrese de llegar a un peso Tax adviser.  Tenga cuidado con las infecciones. Si cree que puede tener una infeccin, consulte al mdico para que la revisen.  Asegrese de informarle al mdico si ha tenido trabajo de parto prematuro antes. Esta informacin no tiene Marine scientist el consejo del mdico. Asegrese de hacerle al mdico cualquier pregunta que tenga. Document Revised: 11/09/2015 Document Reviewed: 07/26/2015 Elsevier Patient Education  [place].

## 2019-07-28 NOTE — Discharge Instructions (Signed)
Tome Tylenol 1000 mg cada 6 horas segun sea necesario para el dolor.

## 2019-07-28 NOTE — MAU Note (Signed)
Sent over for further eval, was to have been scheduled for OP [place], appt not made- pt elected to come here. Pain in RUQ since 2nd month of preg, getting worse.. 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 No nausea, just heartburn- increased pain with the heartburn.

## 2019-07-28 NOTE — Progress Notes (Signed)
Subjective:  Natalie Snow is a 34 y.o. [phone] at [redacted]w[redacted]d being seen today for a problem visit.  She is currently monitored for the following issues for this high-risk pregnancy and has History of cesarean delivery, currently pregnant; Advanced maternal age in multigravida; Migraines; Alpha thalassemia silent carrier; Missed abortion; Supervision of high risk pregnancy, antepartum; Short interval between pregnancies affecting pregnancy, antepartum; and Pregnancy affected by fetal growth restriction on their problem list.   Patient reports she came here today looking for an ultrasound to assess whether or not she has gallstones. Patient was reporting RUQ pain at last prenatal visit on 07/06/2019 and an [place] was ordered but not scheduled. Patient called to be seen earlier than her regularly scheduled prenatal visit because she reports the pain has worsened and is now constant. Patient reports she has been taking the pain medication prescribed for her headaches which help her headaches but do not help her RUQ pain. Patient denies fever, chills, nausea, vomiting, but does endorse heartburn and states she will purchase Pepcid OTC.  Patient reports per above.  Contractions: Not present. Vag. Bleeding: None.  Movement: Present. Denies leaking of fluid.   Spanish interpreter used for entire visit.  The following portions of the patient's history were reviewed and updated as appropriate: allergies, current medications, past family history, past medical history, past social history, past surgical history and problem list. Problem list updated.  Objective:   Vitals:   07/28/19 1110  BP: 137/77  Pulse: (!) 120    Fetal Status: Fetal Heart Rate (bpm): 145   Movement: Present     General:  Alert, oriented and cooperative. Patient is in no acute distress.  Skin: Skin is warm and dry. No rash noted.   Cardiovascular: Normal heart rate noted  Respiratory: Normal respiratory effort, no problems with  respiration noted  Abdomen: Soft, gravid, appropriate for gestational age. Pain/Pressure: Absent     Pelvic: Vag. Bleeding: None     Cervical exam deferred        Extremities: Normal range of motion.  Edema: None  Mental Status: Normal mood and affect. Normal behavior. Normal judgment and thought content.   Urinalysis:      Assessment and Plan:  Pregnancy: [id number] at [redacted]w[redacted]d  1. RUQ pain -pt given option to schedule outpatient [place] or proceed to MAU for immediate evaluation -pt elects to proceed to MAU, MAU provider notified -ROB scheduled 08/04/2019   Preterm labor symptoms and general obstetric precautions including but not limited to vaginal bleeding, contractions, leaking of fluid and fetal movement were reviewed in detail with the patient. Please refer to After Visit Summary for other counseling recommendations.  No follow-ups on file.   Kisa Fujii, Odie Sera, NP

## 2019-07-28 NOTE — Progress Notes (Signed)
Sharp pains on sides previous doctor say its  gallstones   Has lots of heart burn also

## 2019-07-28 NOTE — MAU Provider Note (Signed)
History     CSN: [id number]  Arrival date and time: 07/28/19 1312   First Provider Initiated Contact with Patient 07/28/19 1559 - Spanish Interpreter Mattie Marlin used for entire visit    Chief Complaint  Patient presents with  . Abdominal Pain   [name] is a 34 y.o. year old [id number] female at [redacted]w[redacted]d weeks gestation who was sent to MAU from [facility] with complaints of RUQ pain since 8 wks that is increasing. She reports her pain is a 3/10 now, but "through the roof" when she is working. She denies nausea; just heartburn. She reports increased abdominal pain when she has heartburn. She reports having heartburn and increased pain with any and everything she eats. She has only tried Tums for the heartburn and it does not help. She has not tried any medication for the pain. She does not have a h/o GI issues.   OB History    Gravida  4   Para  2   Term  2   Preterm      AB  1   Living  2     SAB  1   TAB      Ectopic      Multiple      Live Births  2           Past Medical History:  Diagnosis Date  . Medical history non-contributory   . UTI (urinary tract infection)     Past Surgical History:  Procedure Laterality Date  . CESAREAN SECTION     x2   2004, 2010    Family History  Problem Relation Age of Onset  . Thyroid disease Mother   . Thyroid disease Sister     Social History   Tobacco Use  . Smoking status: Never Smoker  . Smokeless tobacco: Never Used  Substance Use Topics  . Alcohol use: Not Currently    Comment: occasionally  . Drug use: Never    Allergies: No Known Allergies  Medications Prior to Admission  Medication Sig Dispense Refill Last Dose  . cyclobenzaprine (FLEXERIL) 10 MG tablet Take 1 tablet (10 mg total) by mouth 3 (three) times daily as needed for muscle spasms (Headache). 30 tablet 2 Past Week at Unknown time  . Magnesium Oxide 200 MG TABS Take 1 tablet (200 mg total) by mouth daily. 90 tablet 1  07/27/2019 at Unknown time  . metoCLOPramide (REGLAN) 10 MG tablet Take 1 tablet (10 mg total) by mouth every 8 (eight) hours as needed for nausea or vomiting (Headache). 30 tablet 2 Past Week at Unknown time  . Prenatal Vit-Fe Fumarate-FA (MULTIVITAMIN-PRENATAL) 27-0.8 MG TABS tablet Take 1 tablet by mouth daily at 12 noon. 30 tablet 0 07/27/2019 at Unknown time  . Prenatal Vit-Fe Fumarate-FA (PRENATAL VITAMINS) 28-0.8 MG TABS Take 1 tablet by mouth daily. (Patient not taking: Reported on 07/06/2019) 30 tablet 12 Not Taking at Unknown time    Review of Systems  Constitutional: Negative.   HENT: Negative.   Eyes: Negative.   Respiratory: Negative.   Cardiovascular: Negative.   Gastrointestinal: Positive for abdominal pain (RUQ pain). Negative for constipation, diarrhea, nausea and vomiting.  Endocrine: Negative.   Genitourinary: Negative.   Musculoskeletal: Negative.   Skin: Negative.   Allergic/Immunologic: Negative.   Neurological: Negative.   Hematological: Negative.   Psychiatric/Behavioral: Negative.    Physical Exam   Blood pressure (!) 143/78, pulse 100, temperature 97.8 F (36.6 C), temperature source Oral, resp. rate 20,  height 5\' 6"  (1.676 m), weight 79 kg, last menstrual period 03/01/2019, SpO2 100 %, unknown if currently breastfeeding.  Physical Exam  Nursing note and vitals reviewed. Constitutional: She is oriented to person, place, and time. She appears well-developed and well-nourished.  HENT:  Head: Normocephalic and atraumatic.  Eyes: Pupils are equal, round, and reactive to light.  Cardiovascular: Normal rate.  Respiratory: Effort normal.  Musculoskeletal:        General: Normal range of motion.     Cervical back: Normal range of motion.  Neurological: She is alert and oriented to person, place, and time.  Skin: Skin is warm and dry.  Psychiatric: She has a normal mood and affect. Her behavior is normal. Judgment and thought content normal.    MAU Course   Procedures  MDM RUQ Limited U/S  US ABDOMEN LIMITED RUQ  Result Date: 07/28/2019 CLINICAL DATA:  Right upper quadrant abdominal pain. EXAM: ULTRASOUND ABDOMEN LIMITED RIGHT UPPER QUADRANT COMPARISON:  None. FINDINGS: Gallbladder: No gallstones or wall thickening visualized. No sonographic Murphy sign noted by sonographer. Common bile duct: Diameter: 2 mm which is within normal limits. Liver: No focal lesion identified. Within normal limits in parenchymal echogenicity. Portal vein is patent on color Doppler imaging with normal direction of blood flow towards the liver. Other: None. IMPRESSION: No abnormality seen in the right upper quadrant of the abdomen. Electronically Signed   By: Marijo Conception M.D.   On: 07/28/2019 14:57    Assessment and Plan  Right upper quadrant abdominal pain of unknown etiology  - Discussed results of normal gallbladder U/S - Advised F/U with MD at [facility] visit next week  Gastroesophageal reflux disease without esophagitis  - Rx for Prilosec 20 mg BID - Information provided on GERD   - Discharge patient - Keep scheduled appt with [facility] on 08/04/19 - Patient verbalized an understanding of the plan of care and agrees.    Laury Deep, MSN, CNM 07/28/2019, 3:59 PM

## 2019-07-29 ENCOUNTER — Telehealth (INDEPENDENT_AMBULATORY_CARE_PROVIDER_SITE_OTHER): Payer: BLUE CROSS/BLUE SHIELD | Admitting: Lactation Services

## 2019-07-29 DIAGNOSIS — G43809 Other migraine, not intractable, without status migrainosus: Secondary | ICD-10-CM

## 2019-07-29 NOTE — Telephone Encounter (Signed)
Called Pharmacy in regards to fax. They are not able to get Mg Oxide in 200 mg tablets but can supply 400 mg tablets and have infant take 1/2 pill daily. Approved changes.

## 2019-08-04 ENCOUNTER — Encounter: Payer: Self-pay | Admitting: Obstetrics & Gynecology

## 2019-08-05 ENCOUNTER — Other Ambulatory Visit: Payer: Self-pay

## 2019-08-05 ENCOUNTER — Encounter: Payer: Self-pay | Admitting: Obstetrics and Gynecology

## 2019-08-05 ENCOUNTER — Ambulatory Visit (INDEPENDENT_AMBULATORY_CARE_PROVIDER_SITE_OTHER): Payer: BLUE CROSS/BLUE SHIELD | Admitting: Obstetrics and Gynecology

## 2019-08-05 VITALS — BP 132/73 | HR 93 | Wt 174.3 lb

## 2019-08-05 DIAGNOSIS — Z758 Other problems related to medical facilities and other health care: Secondary | ICD-10-CM

## 2019-08-05 DIAGNOSIS — O34219 Maternal care for unspecified type scar from previous cesarean delivery: Secondary | ICD-10-CM

## 2019-08-05 DIAGNOSIS — R9389 Abnormal findings on diagnostic imaging of other specified body structures: Secondary | ICD-10-CM

## 2019-08-05 DIAGNOSIS — Z603 Acculturation difficulty: Secondary | ICD-10-CM

## 2019-08-05 DIAGNOSIS — O09899 Supervision of other high risk pregnancies, unspecified trimester: Secondary | ICD-10-CM

## 2019-08-05 DIAGNOSIS — O0990 Supervision of high risk pregnancy, unspecified, unspecified trimester: Secondary | ICD-10-CM

## 2019-08-05 DIAGNOSIS — Z3A22 22 weeks gestation of pregnancy: Secondary | ICD-10-CM

## 2019-08-05 DIAGNOSIS — O09892 Supervision of other high risk pregnancies, second trimester: Secondary | ICD-10-CM

## 2019-08-05 DIAGNOSIS — Z789 Other specified health status: Secondary | ICD-10-CM

## 2019-08-05 DIAGNOSIS — O0992 Supervision of high risk pregnancy, unspecified, second trimester: Secondary | ICD-10-CM

## 2019-08-05 DIAGNOSIS — O09522 Supervision of elderly multigravida, second trimester: Secondary | ICD-10-CM

## 2019-08-05 DIAGNOSIS — D563 Thalassemia minor: Secondary | ICD-10-CM

## 2019-08-05 DIAGNOSIS — Z3009 Encounter for other general counseling and advice on contraception: Secondary | ICD-10-CM

## 2019-08-05 NOTE — Progress Notes (Signed)
   PRENATAL VISIT NOTE  Subjective:  Natalie Snow is a 34 y.o. [id number] at [redacted]w[redacted]d being seen today for ongoing prenatal care.  She is currently monitored for the following issues for this high-risk pregnancy and has History of cesarean delivery, currently pregnant; Advanced maternal age in multigravida; Migraines; Alpha thalassemia silent carrier; Missed abortion; Supervision of high risk pregnancy, antepartum; Short interval between pregnancies affecting pregnancy, antepartum; Pregnancy affected by fetal growth restriction; Language barrier; Unwanted fertility; and Abnormal ultrasound on their problem list.  Patient reports no complaints.  Contractions: Not present. Vag. Bleeding: None.  Movement: Present. Denies leaking of fluid.   The following portions of the patient's history were reviewed and updated as appropriate: allergies, current medications, past family history, past medical history, past social history, past surgical history and problem list.   Objective:   Vitals:   08/05/19 1026  BP: 132/73  Pulse: 93  Weight: 174 lb 4.8 oz (79.1 kg)    Fetal Status: Fetal Heart Rate (bpm): 140   Movement: Present     General:  Alert, oriented and cooperative. Patient is in no acute distress.  Skin: Skin is warm and dry. No rash noted.   Cardiovascular: Normal heart rate noted  Respiratory: Normal respiratory effort, no problems with respiration noted  Abdomen: Soft, gravid, appropriate for gestational age.  Pain/Pressure: Present     Pelvic: Cervical exam deferred        Extremities: Normal range of motion.  Edema: Trace  Mental Status: Normal mood and affect. Normal behavior. Normal judgment and thought content.   Assessment and Plan:  Pregnancy: [id number] at [redacted]w[redacted]d  1. Supervision of high risk pregnancy, antepartum  2. Alpha thalassemia silent carrier Child has it FOB does not have condition Has had counseling in last pregnancy  3. Multigravida of advanced maternal age in  second trimester  4. Short interval between pregnancies affecting pregnancy, antepartum  5. History of cesarean delivery, currently pregnant Will need repeat @ 39 weeks Scheduled today  6. Language barrier Spanish  7. Unwanted fertility For BTL  8. Abnormal ultrasound Abnormal dopplers 07/12/19, possible growth restriction, has f/u scheduled 08/09/19   Preterm labor symptoms and general obstetric precautions including but not limited to vaginal bleeding, contractions, leaking of fluid and fetal movement were reviewed in detail with the patient. Please refer to After Visit Summary for other counseling recommendations.   Return in about 4 weeks (around 09/02/2019) for high OB, in person.  Future Appointments  Date Time Provider Department Center  08/09/2019  3:30 PM [facility] NURSE [facility]  08/09/2019  3:30 PM [facility]5 [facility]    Conan Bowens, MD

## 2019-08-09 ENCOUNTER — Ambulatory Visit (HOSPITAL_COMMUNITY): Payer: BLUE CROSS/BLUE SHIELD | Attending: Obstetrics and Gynecology

## 2019-08-09 ENCOUNTER — Other Ambulatory Visit: Payer: Self-pay

## 2019-08-09 ENCOUNTER — Ambulatory Visit: Payer: BLUE CROSS/BLUE SHIELD | Admitting: *Deleted

## 2019-08-09 ENCOUNTER — Encounter: Payer: Self-pay | Admitting: *Deleted

## 2019-08-09 DIAGNOSIS — Z362 Encounter for other antenatal screening follow-up: Secondary | ICD-10-CM | POA: Insufficient documentation

## 2019-08-09 DIAGNOSIS — O09522 Supervision of elderly multigravida, second trimester: Secondary | ICD-10-CM | POA: Diagnosis not present

## 2019-08-09 DIAGNOSIS — O34219 Maternal care for unspecified type scar from previous cesarean delivery: Secondary | ICD-10-CM

## 2019-08-09 DIAGNOSIS — Z363 Encounter for antenatal screening for malformations: Secondary | ICD-10-CM | POA: Diagnosis not present

## 2019-08-09 DIAGNOSIS — O09899 Supervision of other high risk pregnancies, unspecified trimester: Secondary | ICD-10-CM | POA: Diagnosis not present

## 2019-08-09 DIAGNOSIS — O0990 Supervision of high risk pregnancy, unspecified, unspecified trimester: Secondary | ICD-10-CM | POA: Insufficient documentation

## 2019-08-09 DIAGNOSIS — Z3A21 21 weeks gestation of pregnancy: Secondary | ICD-10-CM

## 2019-08-09 DIAGNOSIS — Z148 Genetic carrier of other disease: Secondary | ICD-10-CM | POA: Diagnosis not present

## 2019-08-12 ENCOUNTER — Other Ambulatory Visit: Payer: Self-pay | Admitting: *Deleted

## 2019-08-12 DIAGNOSIS — O09529 Supervision of elderly multigravida, unspecified trimester: Secondary | ICD-10-CM

## 2019-09-01 ENCOUNTER — Other Ambulatory Visit: Payer: Self-pay

## 2019-09-01 ENCOUNTER — Ambulatory Visit (INDEPENDENT_AMBULATORY_CARE_PROVIDER_SITE_OTHER): Payer: BLUE CROSS/BLUE SHIELD | Admitting: Obstetrics and Gynecology

## 2019-09-01 ENCOUNTER — Encounter: Payer: Self-pay | Admitting: Obstetrics and Gynecology

## 2019-09-01 VITALS — BP 130/83 | HR 88 | Wt 177.3 lb

## 2019-09-01 DIAGNOSIS — O26892 Other specified pregnancy related conditions, second trimester: Secondary | ICD-10-CM

## 2019-09-01 DIAGNOSIS — O09899 Supervision of other high risk pregnancies, unspecified trimester: Secondary | ICD-10-CM

## 2019-09-01 DIAGNOSIS — Z23 Encounter for immunization: Secondary | ICD-10-CM | POA: Diagnosis not present

## 2019-09-01 DIAGNOSIS — R9389 Abnormal findings on diagnostic imaging of other specified body structures: Secondary | ICD-10-CM

## 2019-09-01 DIAGNOSIS — D563 Thalassemia minor: Secondary | ICD-10-CM

## 2019-09-01 DIAGNOSIS — Z789 Other specified health status: Secondary | ICD-10-CM

## 2019-09-01 DIAGNOSIS — O09892 Supervision of other high risk pregnancies, second trimester: Secondary | ICD-10-CM

## 2019-09-01 DIAGNOSIS — Z3009 Encounter for other general counseling and advice on contraception: Secondary | ICD-10-CM

## 2019-09-01 DIAGNOSIS — O0990 Supervision of high risk pregnancy, unspecified, unspecified trimester: Secondary | ICD-10-CM

## 2019-09-01 DIAGNOSIS — Z98891 History of uterine scar from previous surgery: Secondary | ICD-10-CM

## 2019-09-01 DIAGNOSIS — R1011 Right upper quadrant pain: Secondary | ICD-10-CM | POA: Diagnosis not present

## 2019-09-01 DIAGNOSIS — O34219 Maternal care for unspecified type scar from previous cesarean delivery: Secondary | ICD-10-CM

## 2019-09-01 DIAGNOSIS — O283 Abnormal ultrasonic finding on antenatal screening of mother: Secondary | ICD-10-CM

## 2019-09-01 DIAGNOSIS — O0992 Supervision of high risk pregnancy, unspecified, second trimester: Secondary | ICD-10-CM

## 2019-09-01 DIAGNOSIS — Z603 Acculturation difficulty: Secondary | ICD-10-CM

## 2019-09-01 NOTE — Progress Notes (Signed)
   PRENATAL VISIT NOTE  Subjective:  Natalie Snow is a 34 y.o. [phone] at [redacted]w[redacted]d being seen today for ongoing prenatal care.  She is currently monitored for the following issues for this high-risk pregnancy and has History of cesarean delivery, currently pregnant; Advanced maternal age in multigravida; Migraines; Alpha thalassemia silent carrier; Missed abortion; Supervision of high risk pregnancy, antepartum; Short interval between pregnancies affecting pregnancy, antepartum; Pregnancy affected by fetal growth restriction; Language barrier; Unwanted fertility; and Abnormal ultrasound on their problem list.  Patient reports pain under right rib cage. Stabbing pain under right rib cage that has worsened over the last month. Not associated with eating. Radiates towards middle of stomach. Worsens with standing/walking, improves with rest. States she si considering staying hoem from work due to pain.  Contractions: Not present. Vag. Bleeding: None.  Movement: Present. Denies leaking of fluid.   The following portions of the patient's history were reviewed and updated as appropriate: allergies, current medications, past family history, past medical history, past social history, past surgical history and problem list.   Objective:   Vitals:   09/01/19 1614  BP: 130/83  Pulse: 88  Weight: 177 lb 4.8 oz (80.4 kg)    Fetal Status: Fetal Heart Rate (bpm): 140   Movement: Present     General:  Alert, oriented and cooperative. Patient is in no acute distress.  Skin: Skin is warm and dry. No rash noted.   Cardiovascular: Normal heart rate noted  Respiratory: Normal respiratory effort, no problems with respiration noted  Abdomen: Soft, gravid, appropriate for gestational age.  Pain/Pressure: Absent  Mildly tender under right lower rib cage at midline   Pelvic: Cervical exam deferred        Extremities: Normal range of motion.  Edema: None  Mental Status: Normal mood and affect. Normal behavior.  Normal judgment and thought content.   Assessment and Plan:  Pregnancy: [id number] at [redacted]w[redacted]d  1. Supervision of high risk pregnancy, antepartum - Tdap vaccine greater than or equal to 7yo IM  2. Short interval between pregnancies affecting pregnancy, antepartum  3. Language barrier Engineer, structural used  4. Unwanted fertility For BTL  5. Abnormal ultrasound Abnormal dopplers on growth, has repeat growth ordered  6. Alpha thalassemia silent carrier  7. History of cesarean delivery, currently pregnant - RCS scheduled today  8. Right upper quadrant abdominal pain Likely musculoskeletal given worsening with movement, improvement with rest but as pain is under rib cage, will obtain labs and repeat RUQ [place] - Comprehensive metabolic panel - US ABDOMEN LIMITED RUQ; Future   Preterm labor symptoms and general obstetric precautions including but not limited to vaginal bleeding, contractions, leaking of fluid and fetal movement were reviewed in detail with the patient. Please refer to After Visit Summary for other counseling recommendations.   Return in about 2 weeks (around 09/15/2019) for high OB, in person.  Future Appointments  Date Time Provider Department Center  09/21/2019  3:45 PM [facility] NURSE [facility]  09/21/2019  3:45 PM [facility]    Conan Bowens, MD

## 2019-09-02 LAB — COMPREHENSIVE METABOLIC PANEL
ALT: 13 IU/L (ref 0–32)
AST: 19 IU/L (ref 0–40)
Albumin/Globulin Ratio: 1.3 (ref 1.2–2.2)
Albumin: 3.8 g/dL (ref 3.8–4.8)
Alkaline Phosphatase: 84 IU/L (ref 48–121)
BUN/Creatinine Ratio: 21 (ref 9–23)
BUN: 9 mg/dL (ref 6–20)
Bilirubin Total: <0.2 mg/dL (ref 0.0–1.2)
CO2: 21 mmol/L (ref 20–29)
Calcium: 9.8 mg/dL (ref 8.7–10.2)
Chloride: 99 mmol/L (ref 96–106)
Creatinine, Ser: 0.43 mg/dL — ABNORMAL LOW (ref 0.57–1.00)
GFR calc Af Amer: 151 mL/min/{1.73_m2} (ref >59)
GFR calc non Af Amer: 131 mL/min/{1.73_m2} (ref >59)
Globulin, Total: 3 g/dL (ref 1.5–4.5)
Glucose: 84 mg/dL (ref 65–99)
Potassium: 4.2 mmol/L (ref 3.5–5.2)
Sodium: 133 mmol/L — ABNORMAL LOW (ref 134–144)
Total Protein: 6.8 g/dL (ref 6.0–8.5)

## 2019-09-07 ENCOUNTER — Ambulatory Visit (HOSPITAL_COMMUNITY)
Admission: RE | Admit: 2019-09-07 | Discharge: 2019-09-07 | Disposition: A | Discharge status: Home / Self Care | Payer: BLUE CROSS/BLUE SHIELD | Source: Ambulatory Visit | Attending: Obstetrics and Gynecology | Admitting: Obstetrics and Gynecology

## 2019-09-07 DIAGNOSIS — R1011 Right upper quadrant pain: Secondary | ICD-10-CM | POA: Diagnosis not present

## 2019-09-13 ENCOUNTER — Telehealth (INDEPENDENT_AMBULATORY_CARE_PROVIDER_SITE_OTHER): Payer: BLUE CROSS/BLUE SHIELD

## 2019-09-13 DIAGNOSIS — Z3A28 28 weeks gestation of pregnancy: Secondary | ICD-10-CM

## 2019-09-13 DIAGNOSIS — O0990 Supervision of high risk pregnancy, unspecified, unspecified trimester: Secondary | ICD-10-CM

## 2019-09-13 NOTE — Telephone Encounter (Addendum)
-----   Message from Conan Bowens, MD sent at 09/09/2019  9:04 AM EDT ----- Unremarkable RUQ [place], please call and let patient know, she is spanish speaking. Thanks.   Called pt with [facility] interpreter [place] [phone]. [place] and lab results discussed. Pt states she will see Dr. Earlene Plater later this week and follow up with any questions.

## 2019-09-17 ENCOUNTER — Encounter: Payer: Self-pay | Admitting: Obstetrics and Gynecology

## 2019-09-17 ENCOUNTER — Other Ambulatory Visit: Payer: Self-pay

## 2019-09-17 ENCOUNTER — Ambulatory Visit (INDEPENDENT_AMBULATORY_CARE_PROVIDER_SITE_OTHER): Payer: BLUE CROSS/BLUE SHIELD | Admitting: Obstetrics and Gynecology

## 2019-09-17 VITALS — BP 134/80 | HR 99 | Wt 180.0 lb

## 2019-09-17 DIAGNOSIS — Z3A28 28 weeks gestation of pregnancy: Secondary | ICD-10-CM

## 2019-09-17 DIAGNOSIS — Z98891 History of uterine scar from previous surgery: Secondary | ICD-10-CM

## 2019-09-17 DIAGNOSIS — O0990 Supervision of high risk pregnancy, unspecified, unspecified trimester: Secondary | ICD-10-CM

## 2019-09-17 DIAGNOSIS — O34219 Maternal care for unspecified type scar from previous cesarean delivery: Secondary | ICD-10-CM

## 2019-09-17 DIAGNOSIS — Z3009 Encounter for other general counseling and advice on contraception: Secondary | ICD-10-CM

## 2019-09-17 DIAGNOSIS — Z789 Other specified health status: Secondary | ICD-10-CM

## 2019-09-17 DIAGNOSIS — R9389 Abnormal findings on diagnostic imaging of other specified body structures: Secondary | ICD-10-CM

## 2019-09-17 DIAGNOSIS — O365990 Maternal care for other known or suspected poor fetal growth, unspecified trimester, not applicable or unspecified: Secondary | ICD-10-CM

## 2019-09-17 DIAGNOSIS — Z603 Acculturation difficulty: Secondary | ICD-10-CM

## 2019-09-17 DIAGNOSIS — O0993 Supervision of high risk pregnancy, unspecified, third trimester: Secondary | ICD-10-CM

## 2019-09-17 DIAGNOSIS — O365930 Maternal care for other known or suspected poor fetal growth, third trimester, not applicable or unspecified: Secondary | ICD-10-CM

## 2019-09-17 DIAGNOSIS — O283 Abnormal ultrasonic finding on antenatal screening of mother: Secondary | ICD-10-CM

## 2019-09-17 DIAGNOSIS — Z758 Other problems related to medical facilities and other health care: Secondary | ICD-10-CM

## 2019-09-17 DIAGNOSIS — O09522 Supervision of elderly multigravida, second trimester: Secondary | ICD-10-CM

## 2019-09-17 NOTE — Progress Notes (Signed)
Will come Tuesday morning for Glucola.

## 2019-09-17 NOTE — Progress Notes (Signed)
   PRENATAL VISIT NOTE  Subjective:  Natalie Snow is a 34 y.o. [id number] at [redacted]w[redacted]d being seen today for ongoing prenatal care.  She is currently monitored for the following issues for this high-risk pregnancy and has History of cesarean delivery, currently pregnant; Advanced maternal age in multigravida; Migraines; Alpha thalassemia silent carrier; Missed abortion; Supervision of high risk pregnancy, antepartum; Short interval between pregnancies affecting pregnancy, antepartum; Pregnancy affected by fetal growth restriction; Language barrier; Unwanted fertility; and Abnormal ultrasound on their problem list.  Patient reports no complaints. Still having colicky abdominal pain when she works, better when she rests. Contractions: Not present. Vag. Bleeding: None.  Movement: Present. Denies leaking of fluid.   The following portions of the patient's history were reviewed and updated as appropriate: allergies, current medications, past family history, past medical history, past social history, past surgical history and problem list.   Objective:   Vitals:   09/17/19 0926  BP: 134/80  Pulse: 99  Weight: 180 lb (81.6 kg)    Fetal Status: Fetal Heart Rate (bpm): 130   Movement: Present     General:  Alert, oriented and cooperative. Patient is in no acute distress.  Skin: Skin is warm and dry. No rash noted.   Cardiovascular: Normal heart rate noted  Respiratory: Normal respiratory effort, no problems with respiration noted  Abdomen: Soft, gravid, appropriate for gestational age.  Pain/Pressure: Absent     Pelvic: Cervical exam deferred        Extremities: Normal range of motion.  Edema: Trace  Mental Status: Normal mood and affect. Normal behavior. Normal judgment and thought content.   Assessment and Plan:  Pregnancy: [id number] at [redacted]w[redacted]d  1. Supervision of high risk pregnancy, antepartum Will come Tuesday for glucola 3rd trim labs at that visit Work restrictions letter given  today  2. Language barrier Engineer, structural used  3. Unwanted fertility For BTL  4. Abnormal ultrasound Abnormal dopplers Has f/u 09/21/19  5. Multigravida of advanced maternal age in second trimester  6. History of cesarean delivery, currently pregnant RCS scheduled for 11/29/19  7. Pregnancy affected by fetal growth restriction Last growth 30%tile   Preterm labor symptoms and general obstetric precautions including but not limited to vaginal bleeding, contractions, leaking of fluid and fetal movement were reviewed in detail with the patient. Please refer to After Visit Summary for other counseling recommendations.   Return in about 4 days (around 09/21/2019) for 2 Hour GTT @815am  .  Future Appointments  Date Time Provider Department Center  09/21/2019  3:45 PM [facility] NURSE [facility]  09/21/2019  3:45 PM [facility]    11/22/2019, MD

## 2019-09-21 ENCOUNTER — Other Ambulatory Visit: Payer: BLUE CROSS/BLUE SHIELD

## 2019-09-21 ENCOUNTER — Other Ambulatory Visit: Payer: Self-pay

## 2019-09-21 ENCOUNTER — Ambulatory Visit: Payer: BLUE CROSS/BLUE SHIELD | Attending: Obstetrics and Gynecology | Admitting: *Deleted

## 2019-09-21 ENCOUNTER — Other Ambulatory Visit: Payer: Self-pay | Admitting: General Practice

## 2019-09-21 ENCOUNTER — Encounter: Payer: Self-pay | Admitting: *Deleted

## 2019-09-21 ENCOUNTER — Ambulatory Visit (HOSPITAL_BASED_OUTPATIENT_CLINIC_OR_DEPARTMENT_OTHER): Payer: BLUE CROSS/BLUE SHIELD

## 2019-09-21 ENCOUNTER — Ambulatory Visit: Payer: BLUE CROSS/BLUE SHIELD

## 2019-09-21 DIAGNOSIS — O09522 Supervision of elderly multigravida, second trimester: Secondary | ICD-10-CM | POA: Insufficient documentation

## 2019-09-21 DIAGNOSIS — Z3A27 27 weeks gestation of pregnancy: Secondary | ICD-10-CM

## 2019-09-21 DIAGNOSIS — O34219 Maternal care for unspecified type scar from previous cesarean delivery: Secondary | ICD-10-CM | POA: Diagnosis not present

## 2019-09-21 DIAGNOSIS — O0990 Supervision of high risk pregnancy, unspecified, unspecified trimester: Secondary | ICD-10-CM | POA: Diagnosis not present

## 2019-09-21 DIAGNOSIS — O09529 Supervision of elderly multigravida, unspecified trimester: Secondary | ICD-10-CM

## 2019-09-21 DIAGNOSIS — Z363 Encounter for antenatal screening for malformations: Secondary | ICD-10-CM | POA: Diagnosis not present

## 2019-09-21 DIAGNOSIS — Z148 Genetic carrier of other disease: Secondary | ICD-10-CM | POA: Diagnosis not present

## 2019-09-21 DIAGNOSIS — O09899 Supervision of other high risk pregnancies, unspecified trimester: Secondary | ICD-10-CM

## 2019-09-22 LAB — CBC
Hematocrit: 35.5 % (ref 34.0–46.6)
Hemoglobin: 11.2 g/dL (ref 11.1–15.9)
MCH: 27.4 pg (ref 26.6–33.0)
MCHC: 31.5 g/dL (ref 31.5–35.7)
MCV: 87 fL (ref 79–97)
Platelets: 213 10*3/uL (ref 150–450)
RBC: 4.09 x10E6/uL (ref 3.77–5.28)
RDW: 14.5 % (ref 11.7–15.4)
WBC: 7.2 10*3/uL (ref 3.4–10.8)

## 2019-09-22 LAB — GLUCOSE TOLERANCE, 2 HOURS W/ 1HR
Glucose, 1 hour: 188 mg/dL — ABNORMAL HIGH (ref 65–179)
Glucose, 2 hour: 122 mg/dL (ref 65–152)
Glucose, Fasting: 95 mg/dL — ABNORMAL HIGH (ref 65–91)

## 2019-09-22 LAB — HIV ANTIBODY (ROUTINE TESTING W REFLEX): HIV Screen 4th Generation wRfx: NONREACTIVE

## 2019-09-22 LAB — RPR: RPR Ser Ql: NONREACTIVE

## 2019-09-24 ENCOUNTER — Encounter: Payer: Self-pay | Admitting: Obstetrics and Gynecology

## 2019-09-24 ENCOUNTER — Telehealth: Payer: Self-pay | Admitting: *Deleted

## 2019-09-24 DIAGNOSIS — O24410 Gestational diabetes mellitus in pregnancy, diet controlled: Secondary | ICD-10-CM | POA: Insufficient documentation

## 2019-09-24 DIAGNOSIS — O24419 Gestational diabetes mellitus in pregnancy, unspecified control: Secondary | ICD-10-CM

## 2019-09-24 NOTE — Telephone Encounter (Addendum)
-----   Message from Conan Bowens, MD sent at 09/24/2019  8:39 AM EDT ----- GDM, needs nutrition/diabetes referral  1105  Called pt w/pacific interpreter [phone]. She was informed of test results indication gestational diabetes and plan of care. She will receive appt for Diabetes education and message will be sent to her MyChart. Pt voiced understanding and had no questions.

## 2019-09-27 ENCOUNTER — Telehealth: Payer: Self-pay | Admitting: Family Medicine

## 2019-09-27 NOTE — Telephone Encounter (Signed)
Attempted to contact patient with Spanish interpreter [facility] [employer] [facility] with appointment information. No answer, Elane Fritz left a voicemail for patient to give the office a call back to receive appointment information

## 2019-09-28 ENCOUNTER — Encounter: Payer: BLUE CROSS/BLUE SHIELD | Admitting: Registered"

## 2019-09-30 ENCOUNTER — Other Ambulatory Visit: Payer: Self-pay

## 2019-09-30 ENCOUNTER — Ambulatory Visit: Payer: BLUE CROSS/BLUE SHIELD | Admitting: Registered"

## 2019-09-30 ENCOUNTER — Encounter: Payer: BLUE CROSS/BLUE SHIELD | Attending: Obstetrics and Gynecology | Admitting: Registered"

## 2019-09-30 DIAGNOSIS — O24419 Gestational diabetes mellitus in pregnancy, unspecified control: Secondary | ICD-10-CM | POA: Insufficient documentation

## 2019-09-30 DIAGNOSIS — Z3A00 Weeks of gestation of pregnancy not specified: Secondary | ICD-10-CM | POA: Insufficient documentation

## 2019-09-30 NOTE — Progress Notes (Signed)
Interpreter services provided by Allena Katz [phone] from [employer] video  Patient was seen on 09/30/19 for Gestational Diabetes self-management. EDD 12/06/19. Patient states no history of GDM. Diet history obtained. Patient eats variety of all food groups including vegetables daily. Beverages include water, orange juice.  Patient is likely consuming excess carbohydrates in the form of tortillas, rice.   The following learning objectives were met by the patient :   States the definition of Gestational Diabetes  States why dietary management is important in controlling blood glucose  Describes the effects of carbohydrates on blood glucose levels  Demonstrates ability to create a balanced meal plan  Demonstrates carbohydrate counting   States when to check blood glucose levels  Demonstrates proper blood glucose monitoring techniques  States the effect of stress and exercise on blood glucose levels  States the importance of limiting caffeine and abstaining from alcohol and smoking  Plan:  Aim for 3 Carbohydrate Choices per meal (45 grams) +/- 1 either way  Aim for 1-2 Carbohydrate Choices per snack Begin reading food labels for Total Carbohydrate of foods If OK with your MD, consider  increasing your activity level by walking, Arm Chair Exercises or other activity daily as tolerated Begin checking Blood Glucose before breakfast and 2 hours after first bite of breakfast, lunch and dinner as directed by MD  Bring Log Book/Sheet and meter to every medical appointment  Baby Scripts: Patient not appropriate for Baby Scripts due to language barrier  Take medication if directed by MD  Blood glucose monitor given: Accu-chek Guide Me Lot #[id number] Exp: 12/07/2020 CBG: 98 mg/dL  Blood glucose monitor Rx called into pharmacy: [place] with Softclix lancets Patient instructed to monitor glucose levels: FBS: 60 - 95 mg/dl 2 hour: <120 mg/dl  Patient received the following  handouts:  Nutrition Diabetes and Pregnancy  Carbohydrate Counting List  Blood glucose Log Sheet  Patient will be seen for follow-up in as needed.

## 2019-10-04 ENCOUNTER — Other Ambulatory Visit: Payer: Self-pay

## 2019-10-04 ENCOUNTER — Ambulatory Visit (INDEPENDENT_AMBULATORY_CARE_PROVIDER_SITE_OTHER): Payer: BLUE CROSS/BLUE SHIELD | Admitting: Obstetrics and Gynecology

## 2019-10-04 VITALS — BP 120/79 | HR 98 | Wt 182.2 lb

## 2019-10-04 DIAGNOSIS — Z3A31 31 weeks gestation of pregnancy: Secondary | ICD-10-CM | POA: Diagnosis not present

## 2019-10-04 DIAGNOSIS — Z98891 History of uterine scar from previous surgery: Secondary | ICD-10-CM

## 2019-10-04 DIAGNOSIS — O34219 Maternal care for unspecified type scar from previous cesarean delivery: Secondary | ICD-10-CM

## 2019-10-04 DIAGNOSIS — O24419 Gestational diabetes mellitus in pregnancy, unspecified control: Secondary | ICD-10-CM

## 2019-10-04 DIAGNOSIS — D563 Thalassemia minor: Secondary | ICD-10-CM

## 2019-10-04 DIAGNOSIS — Z603 Acculturation difficulty: Secondary | ICD-10-CM

## 2019-10-04 DIAGNOSIS — O0990 Supervision of high risk pregnancy, unspecified, unspecified trimester: Secondary | ICD-10-CM

## 2019-10-04 DIAGNOSIS — O09523 Supervision of elderly multigravida, third trimester: Secondary | ICD-10-CM

## 2019-10-04 DIAGNOSIS — O24410 Gestational diabetes mellitus in pregnancy, diet controlled: Secondary | ICD-10-CM

## 2019-10-04 DIAGNOSIS — O0993 Supervision of high risk pregnancy, unspecified, third trimester: Secondary | ICD-10-CM

## 2019-10-04 DIAGNOSIS — Z789 Other specified health status: Secondary | ICD-10-CM

## 2019-10-04 LAB — POCT URINALYSIS DIP (DEVICE)
Bilirubin Urine: NEGATIVE
Glucose, UA: NEGATIVE mg/dL
Hgb urine dipstick: NEGATIVE
Ketones, ur: NEGATIVE mg/dL
Leukocytes,Ua: NEGATIVE
Nitrite: NEGATIVE
Protein, ur: NEGATIVE mg/dL
Specific Gravity, Urine: 1.025 (ref 1.005–1.030)
Urobilinogen, UA: 0.2 mg/dL (ref 0.0–1.0)
pH: 7.5 (ref 5.0–8.0)

## 2019-10-04 MED ORDER — ACCU-CHEK SOFTCLIX LANCETS MISC: 12 refills | Status: DC

## 2019-10-04 MED ORDER — ACCU-CHEK GUIDE VI STRP: ORAL_STRIP | 12 refills | Status: DC

## 2019-10-04 NOTE — Patient Instructions (Signed)
Diabetes mellitus gestacional, cuidados personales Gestational Diabetes Mellitus, Self Care Las mujeres que tienen diabetes gestacional (diabetes mellitus gestacional) deben mantener su nivel de azcar en la sangre (glucosa) dentro de un rango saludable. Es posible hacerlo por medio de lo siguiente:  Alimentacin.  Actividad fsica.  Cambios en el estilo de vida.  Medicamentos o insulina, si es necesario.  [employer] mdicos y de Producer, television/film/video. Si recibe tratamiento para esta afeccin, es posible que ni usted ni su beb en gestacin (feto) se vean afectados. Si no recibe tratamiento, esta afeccin puede causar problemas que pueden ser perjudiciales para usted o su beb en gestacin. Si tiene diabetes gestacional:  Es ms probable que vuelva a tenerla si queda embarazada nuevamente.  Es ms probable que desarrolle diabetes tipo2 en el futuro. Cmo mantenerse informada sobre Retail buyer de azcar en la sangre   Controle su nivel de azcar en la sangre todos los das durante el [place]. Haga los controles con la frecuencia que le hayan indicado.  Llame al mdico si el nivel de azcar en la sangre est por encima de las cifras ideales en dosanlisis seguidos. El mdico fijar objetivos de tratamiento personales para usted. Generalmente, los [employer] niveles de Location manager en la sangre deben ser los siguientes:  Antes de las comidas, o despus de no haber comido durante un tiempo prolongado (en ayunas o preprandial): igual o menor que 95 mg/dl (5,3 mmol/l).  Despus de las comidas (posprandial): ? Una hora despus de una comida: igual o menor que 128m/dl (7,834ml/l). ? Dos horas despus de una comida: igual o menor que 1204ml (6,7mm58ml).  Nivel de A1c (hemoglobinaA1c): del 6% al 6,5%. Cmo controlar los niveles altos y bajos de azcaLocation managerla sangre Signos de un nivel alto de azcar en la sangre Un nivel alto de azcar en la sangre se denomina hiperglucemia. Conozca  cules son los signos de un nivel alto de azcaDispensing opticians signos pueden incluir lo siguiente:  Sentir: ? Sed. ? Hambre. ? Mucho cansancio.  Necesidad de orinGarment/textile technologist mayor frecuencia que lo habitual.  Visin borrosa. Signos de un nivel bajo de azcar en la sangre Un nivel bajo de azcar en la sangre se denomina hipoglucemia. Este cuadro ocurre cuando el nivel de azcar en la sangre es igual o menor que 70mg33m(3,9mmol37m. Los signos pueden incluir lo siguiente:  Sentir: ? Hambre. ? Preocupacin o nervios (ansiedad). ? Sudoracin y piel hIntel Corporationnfusin. ? Mareos. ? Somnolencia. ? Ganas de vomitar (nuseas).  Tener: ? Latidos cardacos acelerados. ? Dolor de cabezaNetherlandsmbios en la visin. ? Hormigueo y falta de sensibilidad (entumecimiento) alrededor de la boca, labios o lengua. ? Movimientos espasmdicos que no puede controlar (convulsiones).  Dificultades para hacer lo siguiente: ? Moverse (coordinacin). ? Dormir. ? Desmayos. ? Molestarse con facilidad (irritabilidad). Tratamiento del nivel bajo de azcar en la sangre Para tratar un nivel bajo de azcar en la sangre, ingiera un alimento o una bebida azucarada de inmediato. Si puede pensar con claridad y tragar de manera segura, siga la regla 15/15, que consiste en lo siguiente:  Consuma 15gramos de un hidrato de carbono de accin rpida (carbohidrato). Hable con su mdico acerca de cunto debera consumir.  Algunos hidratos de carbono de accin rpida son: ? Comprimidos de azcar (pastillas de glucosa). Consuma 3o 4pastillas de glucosa. ? De 6 a 8unidades de caramelos duros. ? De 4 a 6onzas (de 120 a 150ml) 77mugo de frutas. ?  De 4 a 6onzas (de 120 a 134m) de refresco comn (no diettico). ? 1 cucharada (163m de miel o azcar.  Contrlese el nivel de azcar en la sangre 1536mtos despus de ingerir el hidrato de carbono.  Si el nivel de azcar en la sangre todava es igual o menor que  20m8m (3,9mmo25m), ingiera nuevamente 15gramos de un hidrato de carbono.  Si el nivel de azcar en la sangre no supera los 20mg/37m3,9mmol/35mdespus de 3intentos, solicite ayuda de inmediato.  Ingiera una comida o una colacin en el transcurso de 1hora despus de que el nivel de azcar en la sangre se haya normalizado. Tratamiento del nivel muy bajo de azcar en la sangre Si el nivel de azcar en la sangre es igual o menor que 54mg/dl48mmol/l)41mignifica que est muy bajo (hipoglucemia grave). Esto es una emergEngineer, maintenance (IT)re a ver si los sntomas desaparecen. Solicite atencin mdica de inmediato. Comunquese con el servicio de emergencias de su localidad (911 en los [place]). Si su nivel de azcar en la sangre es muy bajo y no puede ingerir ningn alimento ni bebida, tal vez deba aplicarse una inyeccin de glucagn. Un familiar o un amigo deben aprender a controlarle el azcar en la sangre y a aplicarle una inyeccin de glucagn. Pregntele al mdico si debe tener un kit de inyecciones de glucagn en su casa. Siga estas indicaciones en su casa: Medicamentos  Aplquese la insulina y tome los medicamentos para la diabetes como se lo hayan indicado.  Si el mdico le indica que se aplique ms o menos insulina, o que tome ms o menos medicamentos, haga exactamente lo que le diga. LandAmerica Financialquede sin insulina o sin medicamentos. Alimentos   Opte por opciones de alimentos saludables. Estos incluyen los siguientes: ? Pollo, pescado, claras de huevos y frijoles. ? Avena, harina integral, trigo burgol, arroz integral, quinua y mijo. ? Frutas y Lambert Modyas frescas. ? Productos lcteos descremados. ? Frutos secos, aguacate, aceite de oliva y aceite de canola.  Consulte a un especialista en alimentacin (nutricionista). Este profesional puede ayudarla a elaborar Paediatric nursede alimentacin adecuado para usted.  Siga las indicaciones del mdico respecto de lo que no puede comer o  beber.  Beba suficiente lquido para mantener Contractororina) de color amarillo plido.  Ingiera refrigerios saludables entre comidas nutritivas.  Lleve un registro de los hidratos de carbono que consume. Para hacerlo, lea las etiquetas de informacin nutricional y aprenda cules son los tamaos de las porciones de los alimentos.  Siga su plan para los das de enfermedad cuando no pueda comer ni beber normalmente. Elabore eUnited Auto el mdico, de modo que est listo para usarlo. Actividad  Haga actividad fsica durante 30minutos34ms por da, o durante el tiempo que el mdico le rViacome.  Hable con el mdico antes de comenzar una rutina de ejercicio o actividad nueva. Es posible que el mdico le indique que haga cambios en: ? La cantidad de insulina qDover Corporation o los medicamentos que toma. ? Cunto debe comer. Estilo de vida  No beba alcohol.  No use productos que contengan tabaco. Estos incluyen cigarrillos, tabaco para mascar y cigarrilloPsychologist, sport and exerciseita ayuda para dejar de fumar, consulte al mdico.  Aprenda cmo sobrellevar el estrs. Si necesita ayuda para lograrlo, consulte al mdico. CuiMeadWestvacodel cuerpo  Mantngase al da con las vacunas (inmunizaciones).  Cepllese los dientes y las encas Rattanlo dental unaArdelia Mems  o ms veces por da.  Vaya al dentista una vez cada 78mses o con ms frecuencia.  Mantenga un peso sTax adviser Indicaciones generales  TDelphide venta libre y los recetados solamente como se lo haya indicado el mdico.  Pregntele al mdico sobre los riesgos de la presin arterial alta en el embarazo (preeclampsia y eclampsia).  Comparta su plan de atencin de la diabetes con: ? Sus compaeros de trabajo o de la escuela. ? LAnadarko Petroleum Corporationcon las que cNelliston  Hgase pruebas de orina para dProduct managerpresencia de cetonas: ? Cuando est enferma. ? Como se lo haya indicado el  mdico.  Lleve consigo una tarjeta, o use un brazalete o una medalla que indique que tiene diabetes.  Concurra a todas las visitas de control como se lo haya indicado el mdico. Esto es importante. Cuidados despus del parto  Hgase controlar el nivel de azcar en la sangre 4 a 12semanas despus del parto.  Hgase controlar si tiene diabetes una o ms veces en el plazo de 3 aos. Preguntas para hacerle al mdico  Es necesario que me rena con uRadio broadcast assistanten el cuidado de la diabetes?  Dnde puedo encontrar un grupo de a62para mujeres con diabetes gestacional? Dnde buscar ms informacin Para obtener ms informacin sobre la diabetes, visite los siguientes sitios web:  American Diabetes Association (Asociacin Estadounidense de la Diabetes): www.diabetes.org  Centers for Disease Control and Prevention (Librarian, academic (Centros para eBuilding surveyory la Prevencin de EArboriculturist: whttp://www.wolf.info/Resumen  Controle su nivel de azcar en la sangre (glucosa) tAlton Haga los controles con la frecuencia que le hayan indicado.  Aplquese la insulina y tome los medicamentos para la diabetes como se lo hayan indicado.  Concurra a todas las visitas de control como se lo haya indicado el mdico. Esto es importante.  Hgase controlar el nivel de azcar en la sangre 4 a 12semanas despus del parto. Esta informacin no tiene cMarine scientistel consejo del mdico. Asegrese de hacerle al mdico cualquier pregunta que tenga. Document Revised: 10/16/2017 Document Reviewed: 10/16/2017 Elsevier Patient Education  [place]

## 2019-10-04 NOTE — Progress Notes (Signed)
° °  PRENATAL VISIT NOTE  Subjective:  Tyanne Derocher is a 34 y.o. [phone] at [redacted]w[redacted]d being seen today for ongoing prenatal care.  She is currently monitored for the following issues for this high-risk pregnancy and has History of cesarean delivery, currently pregnant; Advanced maternal age in multigravida; Migraines; Alpha thalassemia silent carrier; Missed abortion; Supervision of high risk pregnancy, antepartum; Short interval between pregnancies affecting pregnancy, antepartum; Pregnancy affected by fetal growth restriction; Language barrier; Unwanted fertility; Abnormal ultrasound; and Diet controlled gestational diabetes mellitus (GDM) in third trimester on their problem list.  Patient doing well with no acute concerns today. She reports no complaints.  Contractions: Not present. Vag. Bleeding: None.  Movement: Present. Denies leaking of fluid.    Good blood sugar control noted.  Only 4 days reviewed but all were within range. The following portions of the patient's history were reviewed and updated as appropriate: allergies, current medications, past family history, past medical history, past social history, past surgical history and problem list. Problem list updated.  Objective:   Vitals:   10/04/19 1126  BP: 120/79  Pulse: 98  Weight: 182 lb 3.2 oz (82.6 kg)    Fetal Status: Fetal Heart Rate (bpm): 125 Fundal Height: 31 cm Movement: Present     General:  Alert, oriented and cooperative. Patient is in no acute distress.  Skin: Skin is warm and dry. No rash noted.   Cardiovascular: Normal heart rate noted  Respiratory: Normal respiratory effort, no problems with respiration noted  Abdomen: Soft, gravid, appropriate for gestational age.  Pain/Pressure: Absent     Pelvic: Cervical exam deferred        Extremities: Normal range of motion.  Edema: Trace  Mental Status:  Normal mood and affect. Normal behavior. Normal judgment and thought content.   Assessment and Plan:    Pregnancy: [id number] at [redacted]w[redacted]d  1. Gestational diabetes mellitus (GDM), antepartum, gestational diabetes method of control unspecified  - glucose blood (ACCU-CHEK GUIDE) test strip; Use as instructed; please check blood glucose 4 times daily  Dispense: 100 each; Refill: 12 - Accu-Chek Softclix Lancets lancets; Use as instructed; please check blood glucose 4 times daily  Dispense: 100 each; Refill: 12  2. History of cesarean delivery, currently pregnant Per pt c/s already scheduled, BTL forms signed today  3. Multigravida of advanced maternal age in third trimester   4. Alpha thalassemia silent carrier   5. Supervision of high risk pregnancy, antepartum   6. Language barrier Interpreter present  7. Diet controlled gestational diabetes mellitus (GDM) in third trimester Repeat growth at 36 weeks  Preterm labor symptoms and general obstetric precautions including but not limited to vaginal bleeding, contractions, leaking of fluid and fetal movement were reviewed in detail with the patient.  Please refer to After Visit Summary for other counseling recommendations.   Return in about 2 weeks (around 10/18/2019) for [facility], in person.   Mariel Aloe, MD

## 2019-10-04 NOTE — Progress Notes (Unsigned)
BTL SIGNED ON 10/04/19

## 2019-10-07 ENCOUNTER — Other Ambulatory Visit: Payer: Self-pay

## 2019-10-18 ENCOUNTER — Encounter: Payer: BLUE CROSS/BLUE SHIELD | Admitting: Obstetrics & Gynecology

## 2019-10-28 ENCOUNTER — Encounter: Payer: Self-pay | Admitting: Family Medicine

## 2019-10-28 ENCOUNTER — Ambulatory Visit (INDEPENDENT_AMBULATORY_CARE_PROVIDER_SITE_OTHER): Payer: BLUE CROSS/BLUE SHIELD | Admitting: Family Medicine

## 2019-10-28 ENCOUNTER — Other Ambulatory Visit: Payer: Self-pay

## 2019-10-28 VITALS — BP 131/85 | HR 95 | Wt 180.6 lb

## 2019-10-28 DIAGNOSIS — O365990 Maternal care for other known or suspected poor fetal growth, unspecified trimester, not applicable or unspecified: Secondary | ICD-10-CM

## 2019-10-28 DIAGNOSIS — O0990 Supervision of high risk pregnancy, unspecified, unspecified trimester: Secondary | ICD-10-CM

## 2019-10-28 DIAGNOSIS — O24410 Gestational diabetes mellitus in pregnancy, diet controlled: Secondary | ICD-10-CM

## 2019-10-28 DIAGNOSIS — O09523 Supervision of elderly multigravida, third trimester: Secondary | ICD-10-CM

## 2019-10-28 DIAGNOSIS — O34219 Maternal care for unspecified type scar from previous cesarean delivery: Secondary | ICD-10-CM

## 2019-10-28 LAB — POCT URINALYSIS DIP (DEVICE)
Bilirubin Urine: NEGATIVE
Glucose, UA: NEGATIVE mg/dL
Hgb urine dipstick: NEGATIVE
Ketones, ur: NEGATIVE mg/dL
Leukocytes,Ua: NEGATIVE
Nitrite: NEGATIVE
Protein, ur: 30 mg/dL — AB
Specific Gravity, Urine: 1.02 (ref 1.005–1.030)
Urobilinogen, UA: 0.2 mg/dL (ref 0.0–1.0)
pH: 7.5 (ref 5.0–8.0)

## 2019-10-28 NOTE — Patient Instructions (Signed)
 Lactancia materna Breastfeeding  Decidir amamantar es una de las mejores elecciones que puede hacer por usted y su beb. Un cambio en las hormonas durante el embarazo hace que las mamas produzcan leche materna en las glndulas productoras de leche. Las hormonas impiden que la leche materna sea liberada antes del nacimiento del beb. Adems, impulsan el flujo de leche luego del nacimiento. Una vez que ha comenzado a amamantar, pensar en el beb, as como la succin o el llanto, pueden estimular la liberacin de leche de las glndulas productoras de leche. Los beneficios de amamantar Las investigaciones demuestran que la lactancia materna ofrece muchos beneficios de salud para bebs y madres. Adems, ofrece una forma gratuita y conveniente de alimentar al beb. Para el beb  La primera leche (calostro) ayuda a mejorar el funcionamiento del aparato digestivo del beb.  Las clulas especiales de la leche (anticuerpos) ayudan a combatir las infecciones en el beb.  Los bebs que se alimentan con leche materna tambin tienen menos probabilidades de tener asma, alergias, obesidad o diabetes de tipo 2. Adems, tienen menor riesgo de sufrir el sndrome de muerte sbita del lactante (SMSL).  Los nutrientes de la leche materna son mejores para satisfacer las necesidades del beb en comparacin con la leche maternizada.  La leche materna mejora el desarrollo cerebral del beb. Para usted  La lactancia materna favorece el desarrollo de un vnculo muy especial entre la madre y el beb.  Es conveniente. La leche materna es econmica y siempre est disponible a la temperatura correcta.  La lactancia materna ayuda a quemar caloras. Le ayuda a perder el peso ganado durante el embarazo.  Hace que el tero vuelva al tamao que tena antes del embarazo ms rpido. Adems, disminuye el sangrado (loquios) despus del parto.  La lactancia materna contribuye a reducir el riesgo de tener diabetes de tipo 2,  osteoporosis, artritis reumatoide, enfermedades cardiovasculares y cncer de mama, ovario, tero y endometrio en el futuro. Informacin bsica sobre la lactancia Comienzo de la lactancia  Encuentre un lugar cmodo para sentarse o acostarse, con un buen respaldo para el cuello y la espalda.  Coloque una almohada o una manta enrollada debajo del beb para acomodarlo a la altura de la mama (si est sentada). Las almohadas para amamantar se han diseado especialmente a fin de servir de apoyo para los brazos y el beb mientras amamanta.  Asegrese de que la barriga del beb (abdomen) est frente a la suya.  Masajee suavemente la mama. Con las yemas de los dedos, masajee los bordes exteriores de la mama hacia adentro, en direccin al pezn. Esto estimula el flujo de leche. Si la leche fluye lentamente, es posible que deba continuar con este movimiento durante la lactancia.  Sostenga la mama con 4 dedos por debajo y el pulgar por arriba del pezn (forme la letra "C" con la mano). Asegrese de que los dedos se encuentren lejos del pezn y de la boca del beb.  Empuje suavemente los labios del beb con el pezn o con el dedo.  Cuando la boca del beb se abra lo suficiente, acrquelo rpidamente a la mama e introduzca todo el pezn y la arola, tanto como sea posible, dentro de la boca del beb. La arola es la zona de color que rodea al pezn. ? Debe haber ms arola visible por arriba del labio superior del beb que por debajo del labio inferior. ? Los labios del beb deben estar abiertos y extendidos hacia afuera (evertidos) para asegurar   que el beb se prenda de forma adecuada y cmoda. ? La lengua del beb debe estar entre la enca inferior y la mama.  Asegrese de que la boca del beb est en la posicin correcta alrededor del pezn (prendido). Los labios del beb deben crear un sello sobre la mama y estar doblados hacia afuera (invertidos).  Es comn que el beb succione durante 2 a 3 minutos  para que comience el flujo de leche materna. Cmo debe prenderse Es muy importante que le ensee al beb cmo prenderse adecuadamente a la mama. Si el beb no se prende adecuadamente, puede causar dolor en los pezones, reducir la produccin de leche materna y hacer que el beb tenga un escaso aumento de peso. Adems, si el beb no se prende adecuadamente al pezn, puede tragar aire durante la alimentacin. Esto puede causarle molestias al beb. Hacer eructar al beb al cambiar de mama puede ayudarlo a liberar el aire. Sin embargo, ensearle al beb cmo prenderse a la mama adecuadamente es la mejor manera de evitar que se sienta molesto por tragar aire mientras se alimenta. Signos de que el beb se ha prendido adecuadamente al pezn  Tironea o succiona de modo silencioso, sin causarle dolor. Los labios del beb deben estar extendidos hacia afuera (evertidos).  Se escucha que traga cada 3 o 4 succiones una vez que la leche ha comenzado a fluir (despus de que se produzca el reflejo de eyeccin de la leche).  Hay movimientos musculares por arriba y por delante de sus odos al succionar. Signos de que el beb no se ha prendido adecuadamente al pezn  Hace ruidos de succin o de chasquido mientras se alimenta.  Siente dolor en los pezones. Si cree que el beb no se prendi correctamente, deslice el dedo en la comisura de la boca y colquelo entre las encas del beb para interrumpir la succin. Intente volver a comenzar a amamantar. Signos de lactancia materna exitosa Signos del beb  El beb disminuir gradualmente el nmero de succiones o dejar de succionar por completo.  El beb se quedar dormido.  El cuerpo del beb se relajar.  El beb retendr una pequea cantidad de leche en la boca.  El beb se desprender solo del pecho. Signos que presenta usted  Las mamas han aumentado la firmeza, el peso y el tamao 1 a 3 horas despus de amamantar.  Estn ms blandas inmediatamente despus  de amamantar.  Se producen un aumento del volumen de leche y un cambio en su consistencia y color hacia el quinto da de lactancia.  Los pezones no duelen, no estn agrietados ni sangran. Signos de que su beb recibe la cantidad de leche suficiente  Mojar por lo menos 1 o 2paales durante las primeras 24horas despus del nacimiento.  Mojar por lo menos 5 o 6paales cada 24horas durante la primera semana despus del nacimiento. La orina debe ser clara o de color amarillo plido a los 5das de vida.  Mojar entre 6 y 8paales cada 24horas a medida que el beb sigue creciendo y desarrollndose.  Defeca por lo menos 3 veces en 24 horas a los 5 das de vida. Las heces deben ser blandas y amarillentas.  Defeca por lo menos 3 veces en 24 horas a los 7 das de vida. Las heces deben ser grumosas y amarillentas.  No registra una prdida de peso mayor al 10% del peso al nacer durante los primeros 3 das de vida.  Aumenta de peso un promedio de 4   a 7onzas (113 a 198g) por semana despus de los 4 das de vida.  Aumenta de peso, diariamente, de manera uniforme a partir de los 5 das de vida, sin registrar prdida de peso despus de las 2semanas de vida. Despus de alimentarse, es posible que el beb regurgite una pequea cantidad de leche. Esto es normal. Frecuencia y duracin de la lactancia El amamantamiento frecuente la ayudar a producir ms leche y puede prevenir dolores en los pezones y las mamas extremadamente llenas (congestin mamaria). Alimente al beb cuando muestre signos de hambre o si siente la necesidad de reducir la congestin de las mamas. Esto se denomina "lactancia a demanda". Las seales de que el beb tiene hambre incluyen las siguientes:  Aumento del estado de alerta, actividad o inquietud.  Mueve la cabeza de un lado a otro.  Abre la boca cuando se le toca la mejilla o la comisura de la boca (reflejo de bsqueda).  Aumenta las vocalizaciones, tales como sonidos de  succin, se relame los labios, emite arrullos, suspiros o chirridos.  Mueve la mano hacia la boca y se chupa los dedos o las manos.  Est molesto o llora. Evite el uso del chupete en las primeras 4 a 6 semanas despus del nacimiento del beb. Despus de este perodo, podr usar un chupete. Las investigaciones demostraron que el uso del chupete durante el primer ao de vida del beb disminuye el riesgo de tener el sndrome de muerte sbita del lactante (SMSL). Permita que el nio se alimente en cada mama todo lo que desee. Cuando el beb se desprende o se queda dormido mientras se est alimentando de la primera mama, ofrzcale la segunda. Debido a que, con frecuencia, los recin nacidos estn somnolientos las primeras semanas de vida, es posible que deba despertar al beb para alimentarlo. Los horarios de lactancia varan de un beb a otro. Sin embargo, las siguientes reglas pueden servir como gua para ayudarla a garantizar que el beb se alimenta adecuadamente:  Se puede amamantar a los recin nacidos (bebs de 4 semanas o menos de vida) cada 1 a 3 horas.  No deben transcurrir ms de 3 horas durante el da o 5 horas durante la noche sin que se amamante a los recin nacidos.  Debe amamantar al beb un mnimo de 8 veces en un perodo de 24 horas. Extraccin de leche materna     La extraccin y el almacenamiento de la leche materna le permiten asegurarse de que el beb se alimente exclusivamente de su leche materna, aun en momentos en los que no puede amamantar. Esto tiene especial importancia si debe regresar al trabajo en el perodo en que an est amamantando o si no puede estar presente en los momentos en que el beb debe alimentarse. Su asesor en lactancia puede ayudarla a encontrar un mtodo de extraccin que funcione mejor para usted y orientarla sobre cunto tiempo es seguro almacenar leche materna. Cmo cuidar las mamas durante la lactancia Los pezones pueden secarse, agrietarse y doler  durante la lactancia. Las siguientes recomendaciones pueden ayudarla a mantener las mamas humectadas y sanas:  Evite usar jabn en los pezones.  Use un sostn de soporte diseado especialmente para la lactancia materna. Evite usar sostenes con aro o sostenes muy ajustados (sostenes deportivos).  Seque al aire sus pezones durante 3 a 4minutos despus de amamantar al beb.  Utilice solo apsitos de algodn en el sostn para absorber las prdidas de leche. La prdida de un poco de leche materna entre   las tomas es normal.  Utilice lanolina sobre los pezones luego de amamantar. La lanolina ayuda a mantener la humedad normal de la piel. La lanolina pura no es perjudicial (no es txica) para el beb. Adems, puede extraer manualmente algunas gotas de leche materna y masajear suavemente esa leche sobre los pezones para que la leche se seque al aire. Durante las primeras semanas despus del nacimiento, algunas mujeres experimentan congestin mamaria. La congestin mamaria puede hacer que sienta las mamas pesadas, calientes y sensibles al tacto. El pico de la congestin mamaria ocurre en el plazo de los 3 a 5 das despus del parto. Las siguientes recomendaciones pueden ayudarla a aliviar la congestin mamaria:  Vace por completo las mamas al amamantar o extraer leche. Puede aplicar calor hmedo en las mamas (en la ducha o con toallas hmedas para manos) antes de amamantar o extraer leche. Esto aumenta la circulacin y ayuda a que la leche fluya. Si el beb no vaca por completo las mamas cuando lo amamanta, extraiga la leche restante despus de que haya finalizado.  Aplique compresas de hielo sobre las mamas inmediatamente despus de amamantar o extraer leche, a menos que le resulte demasiado incmodo. Haga lo siguiente: ? Ponga el hielo en una bolsa plstica. ? Coloque una toalla entre la piel y la bolsa de hielo. ? Coloque el hielo durante 20minutos, 2 o 3veces por da.  Asegrese de que el beb  est prendido y se encuentre en la posicin correcta mientras lo alimenta. Si la congestin mamaria persiste luego de 48 horas o despus de seguir estas recomendaciones, comunquese con su mdico o un asesor en lactancia. Recomendaciones de salud general durante la lactancia  Consuma 3 comidas y 3 colaciones saludables todos los das. Las madres bien alimentadas que amamantan necesitan entre 450 y 500 caloras adicionales por da. Puede cumplir con este requisito al aumentar la cantidad de una dieta equilibrada que realice.  Beba suficiente agua para mantener la orina clara o de color amarillo plido.  Descanse con frecuencia, reljese y siga tomando sus vitaminas prenatales para prevenir la fatiga, el estrs y los niveles bajos de vitaminas y minerales en el cuerpo (deficiencias de nutrientes).  No consuma ningn producto que contenga nicotina o tabaco, como cigarrillos y cigarrillos electrnicos. El beb puede verse afectado por las sustancias qumicas de los cigarrillos que pasan a la leche materna y por la exposicin al humo ambiental del tabaco. Si necesita ayuda para dejar de fumar, consulte al mdico.  Evite el consumo de alcohol.  No consuma drogas ilegales o marihuana.  Antes de usar cualquier medicamento, hable con el mdico. Estos incluyen medicamentos recetados y de venta libre, como tambin vitaminas y suplementos a base de hierbas. Algunos medicamentos, que pueden ser perjudiciales para el beb, pueden pasar a travs de la leche materna.  Puede quedar embarazada durante la lactancia. Si se desea un mtodo anticonceptivo, consulte al mdico sobre cules son las opciones seguras durante la lactancia. Dnde encontrar ms informacin: Liga internacional La Leche: www.llli.org. Comunquese con un mdico si:  Siente que quiere dejar de amamantar o se siente frustrada con la lactancia.  Sus pezones estn agrietados o sangran.  Sus mamas estn irritadas, sensibles o  calientes.  Tiene los siguientes sntomas: ? Dolor en las mamas o en los pezones. ? Un rea hinchada en cualquiera de las mamas. ? Fiebre o escalofros. ? Nuseas o vmitos. ? Drenaje de otro lquido distinto de la leche materna desde los pezones.  Sus mamas no   se llenan antes de amamantar al beb para el quinto da despus del parto.  Se siente triste y deprimida.  El beb: ? Est demasiado somnoliento como para comer bien. ? Tiene problemas para dormir. ? Tiene ms de 1 semana de vida y moja menos de 6 paales en un periodo de 24 horas. ? No ha aumentado de peso a los 5 das de vida.  El beb defeca menos de 3 veces en 24 horas.  La piel del beb o las partes blancas de los ojos se vuelven amarillentas. Solicite ayuda de inmediato si:  El beb est muy cansado (letargo) y no se quiere despertar para comer.  Le sube la fiebre sin causa. Resumen  La lactancia materna ofrece muchos beneficios de salud para bebs y madres.  Intente amamantar a su beb cuando muestre signos tempranos de hambre.  Haga cosquillas o empuje suavemente los labios del beb con el dedo o el pezn para lograr que el beb abra la boca. Acerque el beb a la mama. Asegrese de que la mayor parte de la arola se encuentre dentro de la boca del beb. Ofrzcale una mama y haga eructar al beb antes de pasar a la otra.  Hable con su mdico o asesor en lactancia si tiene dudas o problemas con la lactancia. Esta informacin no tiene como fin reemplazar el consejo del mdico. Asegrese de hacerle al mdico cualquier pregunta que tenga. Document Revised: 05/29/2017 Document Reviewed: 06/24/2016 Elsevier Patient Education  2020 Elsevier Inc.  

## 2019-10-28 NOTE — Progress Notes (Signed)
   PRENATAL VISIT NOTE  Subjective:  Natalie Snow is a 34 y.o. [phone] at [redacted]w[redacted]d being seen today for ongoing prenatal care.  She is currently monitored for the following issues for this high-risk pregnancy and has History of cesarean delivery, currently pregnant; Advanced maternal age in multigravida; Migraines; Alpha thalassemia silent carrier; Supervision of high risk pregnancy, antepartum; Short interval between pregnancies affecting pregnancy, antepartum; Pregnancy affected by fetal growth restriction; Language barrier; Unwanted fertility; Abnormal ultrasound; and Diet controlled gestational diabetes mellitus (GDM) in third trimester on their problem list.  Patient reports no complaints.  Contractions: Not present. Vag. Bleeding: None.  Movement: Present. Denies leaking of fluid.   The following portions of the patient's history were reviewed and updated as appropriate: allergies, current medications, past family history, past medical history, past social history, past surgical history and problem list.   Objective:   Vitals:   10/28/19 1053  BP: 131/85  Pulse: 95  Weight: 180 lb 9.6 oz (81.9 kg)    Fetal Status: Fetal Heart Rate (bpm): 134 Fundal Height: 32 cm Movement: Present  Presentation: Vertex  General:  Alert, oriented and cooperative. Patient is in no acute distress.  Skin: Skin is warm and dry. No rash noted.   Cardiovascular: Normal heart rate noted  Respiratory: Normal respiratory effort, no problems with respiration noted  Abdomen: Soft, gravid, appropriate for gestational age.  Pain/Pressure: Present     Pelvic: Cervical exam deferred        Extremities: Normal range of motion.  Edema: Trace  Mental Status: Normal mood and affect. Normal behavior. Normal judgment and thought content.   Assessment and Plan:  Pregnancy: [id number] at [redacted]w[redacted]d 1. Diet controlled gestational diabetes mellitus (GDM) in third trimester CBGs are reviewed and noted to be within  range  2. Multigravida of advanced maternal age in third trimester Normal NIPT  3. History of cesarean delivery, currently pregnant Has RDCS and BTL booked  4. Pregnancy affected by fetal growth restriction F/u growth due to GDM - [place] MFM OB FOLLOW UP; Future  5. Supervision of high risk pregnancy, antepartum Cultures next visit  Preterm labor symptoms and general obstetric precautions including but not limited to vaginal bleeding, contractions, leaking of fluid and fetal movement were reviewed in detail with the patient. Please refer to After Visit Summary for other counseling recommendations.   Return in 2 weeks (on 11/11/2019) for [facility], needs [facility]/S, in person.  Future Appointments  Date Time Provider Department Center  10/29/2019  3:30 PM [facility] NURSE [facility]  10/29/2019  3:45 PM [facility]  11/15/2019  8:35 AM Adam Phenix, MD [facility]    Reva Bores, MD

## 2019-10-29 ENCOUNTER — Other Ambulatory Visit: Payer: Self-pay | Admitting: *Deleted

## 2019-10-29 ENCOUNTER — Ambulatory Visit: Payer: BLUE CROSS/BLUE SHIELD | Attending: Obstetrics and Gynecology

## 2019-10-29 ENCOUNTER — Ambulatory Visit: Payer: BLUE CROSS/BLUE SHIELD | Admitting: *Deleted

## 2019-10-29 DIAGNOSIS — Z3A33 33 weeks gestation of pregnancy: Secondary | ICD-10-CM

## 2019-10-29 DIAGNOSIS — O365990 Maternal care for other known or suspected poor fetal growth, unspecified trimester, not applicable or unspecified: Secondary | ICD-10-CM | POA: Diagnosis not present

## 2019-10-29 DIAGNOSIS — O09523 Supervision of elderly multigravida, third trimester: Secondary | ICD-10-CM | POA: Diagnosis not present

## 2019-10-29 DIAGNOSIS — O34219 Maternal care for unspecified type scar from previous cesarean delivery: Secondary | ICD-10-CM

## 2019-10-29 DIAGNOSIS — O09899 Supervision of other high risk pregnancies, unspecified trimester: Secondary | ICD-10-CM

## 2019-10-29 DIAGNOSIS — O0990 Supervision of high risk pregnancy, unspecified, unspecified trimester: Secondary | ICD-10-CM | POA: Insufficient documentation

## 2019-10-29 DIAGNOSIS — O24410 Gestational diabetes mellitus in pregnancy, diet controlled: Secondary | ICD-10-CM

## 2019-10-29 DIAGNOSIS — Z148 Genetic carrier of other disease: Secondary | ICD-10-CM

## 2019-11-15 ENCOUNTER — Ambulatory Visit (INDEPENDENT_AMBULATORY_CARE_PROVIDER_SITE_OTHER): Payer: BLUE CROSS/BLUE SHIELD | Admitting: Obstetrics & Gynecology

## 2019-11-15 ENCOUNTER — Encounter: Payer: Self-pay | Admitting: Obstetrics & Gynecology

## 2019-11-15 ENCOUNTER — Other Ambulatory Visit (HOSPITAL_COMMUNITY)
Admission: RE | Admit: 2019-11-15 | Discharge: 2019-11-15 | Disposition: A | Discharge status: Home / Self Care | Payer: BLUE CROSS/BLUE SHIELD | Source: Ambulatory Visit | Attending: Obstetrics & Gynecology | Admitting: Obstetrics & Gynecology

## 2019-11-15 ENCOUNTER — Other Ambulatory Visit: Payer: Self-pay

## 2019-11-15 VITALS — BP 124/80 | HR 99

## 2019-11-15 DIAGNOSIS — O0990 Supervision of high risk pregnancy, unspecified, unspecified trimester: Secondary | ICD-10-CM | POA: Diagnosis not present

## 2019-11-15 DIAGNOSIS — O24410 Gestational diabetes mellitus in pregnancy, diet controlled: Secondary | ICD-10-CM

## 2019-11-15 DIAGNOSIS — Z3009 Encounter for other general counseling and advice on contraception: Secondary | ICD-10-CM

## 2019-11-15 DIAGNOSIS — O34219 Maternal care for unspecified type scar from previous cesarean delivery: Secondary | ICD-10-CM

## 2019-11-15 LAB — POCT URINALYSIS DIP (DEVICE)
Bilirubin Urine: NEGATIVE
Glucose, UA: NEGATIVE mg/dL
Hgb urine dipstick: NEGATIVE
Ketones, ur: NEGATIVE mg/dL
Leukocytes,Ua: NEGATIVE
Nitrite: NEGATIVE
Protein, ur: NEGATIVE mg/dL
Specific Gravity, Urine: 1.025 (ref 1.005–1.030)
Urobilinogen, UA: 0.2 mg/dL (ref 0.0–1.0)
pH: 7 (ref 5.0–8.0)

## 2019-11-15 NOTE — Patient Instructions (Signed)
Parto por cesrea Cesarean Delivery El nacimiento o parto por cesrea es el parto quirrgico de un beb a travs de una incisin en el abdomen y Careers information officer. Se lo conoce como cesrea. Este procedimiento se puede programar con anticipacin o se puede Publishing copy una situacin de Associate Professor. Informe al mdico acerca de lo siguiente:  Cualquier alergia que tenga.  Todos los [employer], incluidos vitaminas, hierbas, gotas oftlmicas, cremas y [address] de [address].  Cualquier problema previo que usted o algn miembro de su familia haya tenido con los anestsicos.  Cualquier trastorno de la sangre que tenga.  Cirugas a las que se haya sometido.  Cualquier afeccin mdica que tenga.  Si usted o Research scientist (physical sciences) tiene antecedentes de trombosis venosa profunda (TVP) o embolia pulmonar (EP). Cules son los riesgos? En general, se trata de un procedimiento seguro. Sin embargo, pueden ocurrir complicaciones, por ejemplo:  Infeccin.  Sangrado.  Reacciones alrgicas a los medicamentos.  Daos a otras estructuras u rganos.  Cogulos de [place].  Lesiones al beb. Qu ocurre antes del procedimiento? Indicaciones generales  Siga las indicaciones del mdico respecto de las restricciones en las comidas o las bebidas.  Si sabe que va a tener un parto por cesrea, no se rasure la zona pbica. Rasurarse antes del procedimiento puede aumentar el riesgo de infeccin.  Pdale a alguien que la lleve a su casa desde el hospital.  Pregntele al mdico qu medidas se tomarn para prevenir una infeccin. Estas pueden incluir: ? Rasurar el vello del lugar de la ciruga. ? Lavar la piel con un jabn antisptico. ? Suministrar antibiticos.  Segn el motivo del parto por cesrea, es posible que se le realice un examen fsico o una prueba adicional, como una ecografa.  Posiblemente le realicen un anlisis de [place]. Preguntas para hacerle al mdico  Consulte al mdico  sobre: ? Multimedia programmer o suspender los medicamentos que toma habitualmente. Esto es muy importante si toma medicamentos para la diabetes o anticoagulantes. ? El plan para Human resources officer. Esto es muy importante si piensa amamantar a su beb. ? Designer, industrial/product hospital despus del procedimiento. ? Cualquier inquietud que pueda tener sobre recibir hemoderivados, si los necesita durante el procedimiento. ? Almacenamiento de sangre del cordn, si planea guardar la sangre del cordn umbilical del beb.  Quiz tambin desee preguntarle al mdico lo siguiente: ? recuperacin. ? Si un familiar o la persona que usted elija puede ingresar al quirfano y Personal assistant con usted durante el procedimiento, inmediatamente despus de este y durante su recuperacin. Qu ocurre durante el procedimiento?   Le colocarn una va intravenosa en una vena.  Le administrarn lquido y [address], tales como antibiticos, antes de la [place].  Le colocarn monitores fetales en el abdomen para controlar la frecuencia cardaca de su beb.  Se le puede entregar una bata especial de calentamiento para mantener estable la temperatura corporal.  Se le puede insertar un catter en la vejiga a travs de la uretra. Esto se hace para drenar la orina durante el procedimiento.  Pueden administrarle uno o ms de los siguientes medicamentos: ? Un medicamento para adormecer la zona (anestesia local). ? Un medicamento que la har dormir (anestesia general). ? Un medicamento (anestesia regional) que se le inyecta en la espalda o a travs de un tubo pequeo y delgado que se le coloca en la espalda (anestesia raqudea o anestesia epidural). Esto adormece la parte del cuerpo que est por debajo del sitio de  la inyeccin y Environmental consultant despierta durante el procedimiento. Si llega a sentir nuseas, dgaselo al mdico. Tendr medicamentos disponibles para ayudarla a reducir cualquier nusea que pueda sentir.  Le  harn una incisin en el abdomen y [employer].  Si est despierta durante el procedimiento, puede sentir tirones en el abdomen, pero no debera Financial risk analyst. Si siente dolor, dgaselo al mdico inmediatamente.  Se sacar al beb del tero. Es posible que sienta ms presin o un tirn, mientras esto sucede.  Inmediatamente despus del nacimiento, se secar al beb y se lo mantendr caliente. Podr sostener y Museum/gallery exhibitions officer a su beb.  Durante este momento, es posible que se pince y corte el cordn umbilical. Esto ocurre por lo general despus de un perodo de 1 a 2 minutos despus del parto.  Se le extraer la placenta del tero.  Las incisiones se cerrarn con puntos (suturas). Es posible que se apliquen grapas, goma para cerrar la piel o tiras [phone] en la incisin del abdomen.  Pueden colocarle vendas (vendajes) sobre la incisin del abdomen. El procedimiento puede variar segn el mdico y el hospital. Ladell Heads ocurre despus del procedimiento?  Le controlarn la presin arterial, la frecuencia cardaca, la frecuencia respiratoria y Air cabin crew de oxgeno en la sangre hasta que le den el alta del hospital.  Puede seguir recibiendo lquidos o medicamentos por va intravenosa.  Sentir un poco de Engineer, mining. Tendr analgsicos disponibles para ayudarla a Human resources officer.  Para evitar la formacin de cogulos de sangre: ? Se le pueden administrar medicamentos. ? [employer] o dispositivos de compresin. ? Se le indicar que camine cuando pueda hacerlo.  El personal del hospital alentar y apoyar que cree lazos con su beb. En el hospital, es posible que le permitan que el beb permanezca en la misma habitacin que usted (internacin conjunta) durante la hospitalizacin para fomentar la creacin del vnculo y un amamantamiento exitoso.  Se le puede sugerir que tosa y respire profundamente con frecuencia. Esto ayuda a evitar problemas pulmonares.  Si tiene un catter [employer]  drena la orina, se le quitar lo antes posible despus del procedimiento. Resumen  El nacimiento o parto por cesrea es el parto quirrgico de un beb a travs de una incisin en el abdomen y Careers information officer.  Siga las indicaciones del mdico con respecto a las restricciones de comidas o bebidas antes del procedimiento.  Sentir algo de dolor despus del procedimiento. Tendr analgsicos disponibles para ayudarla a Human resources officer.  El personal del hospital alentar y apoyar que cree lazos con su beb despus del procedimiento. En el hospital, es posible que le permitan que el beb permanezca en la misma habitacin que usted (internacin conjunta) durante la hospitalizacin para fomentar la creacin del vnculo y un amamantamiento exitoso. Esta informacin no tiene Theme park manager el consejo del mdico. Asegrese de hacerle al mdico cualquier pregunta que tenga. Document Revised: 10/16/2017 Document Reviewed: 10/16/2017 Elsevier Patient Education  2020 [employer].

## 2019-11-15 NOTE — Progress Notes (Signed)
   PRENATAL VISIT NOTE  Subjective:  Natalie Snow is a 34 y.o. [phone] at [redacted]w[redacted]d being seen today for ongoing prenatal care.  She is currently monitored for the following issues for this high-risk pregnancy and has History of cesarean delivery, currently pregnant; Advanced maternal age in multigravida; Migraines; Alpha thalassemia silent carrier; Supervision of high risk pregnancy, antepartum; Short interval between pregnancies affecting pregnancy, antepartum; Pregnancy affected by fetal growth restriction; Language barrier; Unwanted fertility; Abnormal ultrasound; and Diet controlled gestational diabetes mellitus (GDM) in third trimester on their problem list.  Patient reports no complaints.  Contractions: Not present. Vag. Bleeding: None.  Movement: Present. Denies leaking of fluid.   The following portions of the patient's history were reviewed and updated as appropriate: allergies, current medications, past family history, past medical history, past social history, past surgical history and problem list.   Objective:   Vitals:   11/15/19 0927  BP: 124/80  Pulse: 99    Fetal Status: Fetal Heart Rate (bpm): 129   Movement: Present     General:  Alert, oriented and cooperative. Patient is in no acute distress.  Skin: Skin is warm and dry. No rash noted.   Cardiovascular: Normal heart rate noted  Respiratory: Normal respiratory effort, no problems with respiration noted  Abdomen: Soft, gravid, appropriate for gestational age.  Pain/Pressure: Present     Pelvic: Cervical exam deferred        Extremities: Normal range of motion.  Edema: Trace  Mental Status: Normal mood and affect. Normal behavior. Normal judgment and thought content.   Assessment and Plan:  Pregnancy: [id number] at [redacted]w[redacted]d 1. Supervision of high risk pregnancy, antepartum Routine 37 weeks - GC/Chlamydia probe amp ([place])not at [facility] - Culture, beta strep (group b only)  2. Diet controlled gestational  diabetes mellitus (GDM) in third trimester FBS all < 95 and PP <120  3. History of cesarean delivery, currently pregnant Repeat and BTL 39 weeks  4. Unwanted fertility BTL  Term labor symptoms and general obstetric precautions including but not limited to vaginal bleeding, contractions, leaking of fluid and fetal movement were reviewed in detail with the patient. Please refer to After Visit Summary for other counseling recommendations.   Return in about 1 week (around 11/22/2019) for [facility].  Future Appointments  Date Time Provider Department Center  11/25/2019  9:45 AM [facility] NURSE [facility]  11/25/2019 10:00 AM [facility] US1 [facility]    Scheryl Darter, MD

## 2019-11-16 LAB — GC/CHLAMYDIA PROBE AMP (~~LOC~~) NOT AT ARMC
Chlamydia: NEGATIVE
Comment: NEGATIVE
Comment: NORMAL
Neisseria Gonorrhea: NEGATIVE

## 2019-11-18 LAB — CULTURE, BETA STREP (GROUP B ONLY): Strep Gp B Culture: NEGATIVE

## 2019-11-24 ENCOUNTER — Encounter (HOSPITAL_COMMUNITY): Payer: Self-pay

## 2019-11-24 NOTE — Pre-Procedure Instructions (Signed)
[id number] interpreter number

## 2019-11-24 NOTE — Patient Instructions (Addendum)
Instrucciones Para Antes de la Ciruga   Su ciruga est programada para 11/29/2019  (your procedure is scheduled on) Entre por la entrada principal del [facility]  a las [address] de la [place] -(enter through the main entrance at [facility] at [employer] AM    [address] [place], [place] [address] para informarnos de su llegada. (pick up phone, dial [phone] on arrival)     Por favor llame al [phone] si tiene algn problema en la maana de la ciruga (please call this number if you have any problems the morning of surgery.)                  Recuerde: (Remember)  No coma alimentos. (Do not eat food (After Midnight) Desps de medianoche)    No tome lquidos claros. (Do not drink clear liquids (After Midnight) Desps de medianoche)    No use joyas, maquillaje de ojos, lpiz labial, crema para el cuerpo o esmalte de uas oscuro. (Do not wear jewelry, eye makeup, lipstick, body lotion, or dark fingernail polish). Puede usar desodorante (you may wear deodorant)    No se afeite 48 horas de su ciruga. (Do not shave 48 hours before your surgery)    No traiga objetos de valor al hospital.  [facility] no se hace responsable de ninguna pertenencia, ni objetos de valor que haya trado al hospital. (Do not bring valuable to the hospital.  [facility] is not responsible for any belongings or valuables brought to the hospital)   [facility] medicinas en la maana de la ciruga con un SORBITO de agua nada (take these meds the morning of surgery with a SIP of water)     Durante la ciruga no se pueden usar lentes de contacto, dentaduras o puentes. (Contacts, dentures or bridgework cannot be worn in surgery).   Si va a ser ingresado despus de la ciruga, deje la [employer] en el carro hasta que se le haya asignado una habitacin. (If you are to be admitted after surgery, leave suitcase in car until your room has been assigned.)   A los pacientes que se les d de alta  el mismo da no se les permitir manejar a casa.  (Patients discharged on the day of surgery will not be allowed to drive home)    [place] y nmero de telfono del Programmer, multimedia na. (Name and telephone number of your driver)   Instrucciones especiales N/A (Special Instructions)   Por favor, lea las hojas informativas que le entregaron. (Please read over the following fact sheets that you were given) Surgical Site Infection Prevention

## 2019-11-25 ENCOUNTER — Ambulatory Visit (INDEPENDENT_AMBULATORY_CARE_PROVIDER_SITE_OTHER): Payer: BLUE CROSS/BLUE SHIELD | Admitting: Obstetrics & Gynecology

## 2019-11-25 ENCOUNTER — Ambulatory Visit: Payer: BLUE CROSS/BLUE SHIELD | Admitting: *Deleted

## 2019-11-25 ENCOUNTER — Other Ambulatory Visit: Payer: Self-pay

## 2019-11-25 ENCOUNTER — Ambulatory Visit: Payer: BLUE CROSS/BLUE SHIELD | Attending: Obstetrics and Gynecology

## 2019-11-25 VITALS — BP 140/91 | HR 100 | Wt 187.0 lb

## 2019-11-25 DIAGNOSIS — O09523 Supervision of elderly multigravida, third trimester: Secondary | ICD-10-CM | POA: Diagnosis not present

## 2019-11-25 DIAGNOSIS — Z148 Genetic carrier of other disease: Secondary | ICD-10-CM

## 2019-11-25 DIAGNOSIS — O09899 Supervision of other high risk pregnancies, unspecified trimester: Secondary | ICD-10-CM | POA: Diagnosis not present

## 2019-11-25 DIAGNOSIS — Z3A37 37 weeks gestation of pregnancy: Secondary | ICD-10-CM

## 2019-11-25 DIAGNOSIS — Z3009 Encounter for other general counseling and advice on contraception: Secondary | ICD-10-CM

## 2019-11-25 DIAGNOSIS — O24410 Gestational diabetes mellitus in pregnancy, diet controlled: Secondary | ICD-10-CM | POA: Diagnosis not present

## 2019-11-25 DIAGNOSIS — Z362 Encounter for other antenatal screening follow-up: Secondary | ICD-10-CM

## 2019-11-25 DIAGNOSIS — O34219 Maternal care for unspecified type scar from previous cesarean delivery: Secondary | ICD-10-CM

## 2019-11-25 DIAGNOSIS — O0990 Supervision of high risk pregnancy, unspecified, unspecified trimester: Secondary | ICD-10-CM | POA: Insufficient documentation

## 2019-11-25 NOTE — Progress Notes (Signed)
   PRENATAL VISIT NOTE  Subjective:  Natalie Snow is a 34 y.o. [phone] at [redacted]w[redacted]d being seen today for ongoing prenatal care.  She is currently monitored for the following issues for this high-risk pregnancy and has History of cesarean delivery, currently pregnant; Advanced maternal age in multigravida; Migraines; Alpha thalassemia silent carrier; Supervision of high risk pregnancy, antepartum; Short interval between pregnancies affecting pregnancy, antepartum; Pregnancy affected by fetal growth restriction; Language barrier; Unwanted fertility; Abnormal ultrasound; and Diet controlled gestational diabetes mellitus (GDM) in third trimester on their problem list.  Patient reports no complaints.  Contractions: Not present. Vag. Bleeding: None.  Movement: Present. Denies leaking of fluid. Pt has not been taking blood glucose on schedule, discussed importance of adherence to schedule.   The following portions of the patient's history were reviewed and updated as appropriate: allergies, current medications, past family history, past medical history, past social history, past surgical history and problem list.   Objective:   Vitals:   11/25/19 0936 11/25/19 0938  BP: (!) 148/94 (!) 140/91  Pulse: 99 100  Weight: 187 lb (84.8 kg)     Fetal Status: Fetal Heart Rate (bpm): 143   Movement: Present     General:  Alert, oriented and cooperative. Patient is in no acute distress.  Skin: Skin is warm and dry. No rash noted.   Cardiovascular: Normal heart rate noted  Respiratory: Normal respiratory effort, no problems with respiration noted  Abdomen: Soft, gravid, appropriate for gestational age.  Pain/Pressure: Present     Pelvic: Cervical exam deferred        Extremities: Normal range of motion.  Edema: Trace  Mental Status: Normal mood and affect. Normal behavior. Normal judgment and thought content.   Assessment and Plan:  Pregnancy: [id number] at [redacted]w[redacted]d 1. Supervision of high risk pregnancy,  antepartum Elevated BP today, asx will recheck after U/S  2. History of cesarean delivery, currently pregnant C/S scheduled  3. Unwanted fertility BTL W/ C/S  Term labor symptoms and general obstetric precautions including but not limited to vaginal bleeding, contractions, leaking of fluid and fetal movement were reviewed in detail with the patient. Please refer to After Visit Summary for other counseling recommendations.   No follow-ups on file.  Future Appointments  Date Time Provider Department Center  11/27/2019  8:20 AM [facility] 1 [facility] None    Malachy Chamber, MD

## 2019-11-27 ENCOUNTER — Other Ambulatory Visit (HOSPITAL_COMMUNITY)
Admission: RE | Admit: 2019-11-27 | Discharge: 2019-11-27 | Disposition: A | Discharge status: Home / Self Care | Payer: BLUE CROSS/BLUE SHIELD | Source: Ambulatory Visit | Attending: Obstetrics and Gynecology | Admitting: Obstetrics and Gynecology

## 2019-11-27 ENCOUNTER — Other Ambulatory Visit: Payer: Self-pay

## 2019-11-27 DIAGNOSIS — Z20822 Contact with and (suspected) exposure to covid-19: Secondary | ICD-10-CM | POA: Diagnosis not present

## 2019-11-27 DIAGNOSIS — Z302 Encounter for sterilization: Secondary | ICD-10-CM | POA: Diagnosis not present

## 2019-11-27 DIAGNOSIS — N838 Other noninflammatory disorders of ovary, fallopian tube and broad ligament: Secondary | ICD-10-CM | POA: Diagnosis not present

## 2019-11-27 DIAGNOSIS — Z01812 Encounter for preprocedural laboratory examination: Secondary | ICD-10-CM | POA: Diagnosis not present

## 2019-11-27 DIAGNOSIS — O34211 Maternal care for low transverse scar from previous cesarean delivery: Secondary | ICD-10-CM | POA: Diagnosis not present

## 2019-11-27 DIAGNOSIS — O24420 Gestational diabetes mellitus in childbirth, diet controlled: Secondary | ICD-10-CM | POA: Diagnosis not present

## 2019-11-27 DIAGNOSIS — Z3A39 39 weeks gestation of pregnancy: Secondary | ICD-10-CM | POA: Diagnosis not present

## 2019-11-27 DIAGNOSIS — Z23 Encounter for immunization: Secondary | ICD-10-CM | POA: Diagnosis not present

## 2019-11-27 HISTORY — DX: Gestational diabetes mellitus in pregnancy, unspecified control: O24.419

## 2019-11-27 LAB — CBC
HCT: 39.2 % (ref 36.0–46.0)
Hemoglobin: 12.7 g/dL (ref 12.0–15.0)
MCH: 29.2 pg (ref 26.0–34.0)
MCHC: 32.4 g/dL (ref 30.0–36.0)
MCV: 90.1 fL (ref 80.0–100.0)
Platelets: 174 10*3/uL (ref 150–400)
RBC: 4.35 MIL/uL (ref 3.87–5.11)
RDW: 13.7 % (ref 11.5–15.5)
WBC: 7.2 10*3/uL (ref 4.0–10.5)
nRBC: 0 % (ref 0.0–0.2)

## 2019-11-27 LAB — TYPE AND SCREEN
ABO/RH(D): A POS
Antibody Screen: NEGATIVE

## 2019-11-27 LAB — SARS CORONAVIRUS 2 (TAT 6-24 HRS): SARS Coronavirus 2: NEGATIVE

## 2019-11-27 NOTE — MAU Note (Signed)
Pt here for covid swab and labs. Denies symptoms or sick contacts. Swab collected.  

## 2019-11-28 LAB — RPR: RPR Ser Ql: NONREACTIVE

## 2019-11-29 ENCOUNTER — Inpatient Hospital Stay (HOSPITAL_COMMUNITY): Payer: BLUE CROSS/BLUE SHIELD | Admitting: Certified Registered Nurse Anesthetist

## 2019-11-29 ENCOUNTER — Inpatient Hospital Stay (HOSPITAL_COMMUNITY)
Admission: RE | Admit: 2019-11-29 | Discharge: 2019-12-01 | DRG: 785 | Disposition: A | Discharge status: Home / Self Care | Payer: BLUE CROSS/BLUE SHIELD | Attending: Obstetrics and Gynecology | Admitting: Obstetrics and Gynecology

## 2019-11-29 ENCOUNTER — Encounter (HOSPITAL_COMMUNITY)
Admission: RE | Disposition: A | Discharge status: Home / Self Care | Payer: Self-pay | Source: Home / Self Care | Attending: Obstetrics and Gynecology

## 2019-11-29 ENCOUNTER — Encounter (HOSPITAL_COMMUNITY): Payer: Self-pay | Admitting: Obstetrics and Gynecology

## 2019-11-29 ENCOUNTER — Other Ambulatory Visit: Payer: Self-pay

## 2019-11-29 DIAGNOSIS — Z3009 Encounter for other general counseling and advice on contraception: Secondary | ICD-10-CM | POA: Diagnosis present

## 2019-11-29 DIAGNOSIS — O34211 Maternal care for low transverse scar from previous cesarean delivery: Principal | ICD-10-CM | POA: Diagnosis present

## 2019-11-29 DIAGNOSIS — Z3A39 39 weeks gestation of pregnancy: Secondary | ICD-10-CM

## 2019-11-29 DIAGNOSIS — Z3490 Encounter for supervision of normal pregnancy, unspecified, unspecified trimester: Secondary | ICD-10-CM

## 2019-11-29 DIAGNOSIS — O24420 Gestational diabetes mellitus in childbirth, diet controlled: Secondary | ICD-10-CM | POA: Diagnosis present

## 2019-11-29 DIAGNOSIS — Z01812 Encounter for preprocedural laboratory examination: Secondary | ICD-10-CM | POA: Diagnosis not present

## 2019-11-29 DIAGNOSIS — O09899 Supervision of other high risk pregnancies, unspecified trimester: Secondary | ICD-10-CM

## 2019-11-29 DIAGNOSIS — O24410 Gestational diabetes mellitus in pregnancy, diet controlled: Secondary | ICD-10-CM | POA: Diagnosis present

## 2019-11-29 DIAGNOSIS — Z302 Encounter for sterilization: Secondary | ICD-10-CM | POA: Diagnosis not present

## 2019-11-29 DIAGNOSIS — Z20822 Contact with and (suspected) exposure to covid-19: Secondary | ICD-10-CM | POA: Diagnosis present

## 2019-11-29 DIAGNOSIS — O34219 Maternal care for unspecified type scar from previous cesarean delivery: Secondary | ICD-10-CM

## 2019-11-29 DIAGNOSIS — Z98891 History of uterine scar from previous surgery: Secondary | ICD-10-CM

## 2019-11-29 LAB — CBC
HCT: 33.8 % — ABNORMAL LOW (ref 36.0–46.0)
Hemoglobin: 11 g/dL — ABNORMAL LOW (ref 12.0–15.0)
MCH: 28.9 pg (ref 26.0–34.0)
MCHC: 32.5 g/dL (ref 30.0–36.0)
MCV: 88.9 fL (ref 80.0–100.0)
Platelets: 151 10*3/uL (ref 150–400)
RBC: 3.8 MIL/uL — ABNORMAL LOW (ref 3.87–5.11)
RDW: 13.5 % (ref 11.5–15.5)
WBC: 12.3 10*3/uL — ABNORMAL HIGH (ref 4.0–10.5)
nRBC: 0 % (ref 0.0–0.2)

## 2019-11-29 LAB — GLUCOSE, CAPILLARY: Glucose-Capillary: 85 mg/dL (ref 70–99)

## 2019-11-29 LAB — CREATININE, SERUM
Creatinine, Ser: 0.64 mg/dL (ref 0.44–1.00)
GFR calc Af Amer: >60 mL/min (ref >60)
GFR calc non Af Amer: >60 mL/min (ref >60)

## 2019-11-29 SURGERY — Surgical Case
Anesthesia: Spinal

## 2019-11-29 MED ORDER — OXYTOCIN-SODIUM CHLORIDE 30-0.9 UT/500ML-% IV SOLN: 2.5000 [IU]/h | INTRAVENOUS | Status: AC

## 2019-11-29 MED ORDER — ENOXAPARIN SODIUM 40 MG/0.4ML ~~LOC~~ SOLN
40.0000 mg | SUBCUTANEOUS | Status: DC
  Administered 2019-11-30 – 2019-12-01 (×2): 40 mg via SUBCUTANEOUS
  Filled 2019-11-29 (×2): qty 0.4

## 2019-11-29 MED ORDER — TETANUS-DIPHTH-ACELL PERTUSSIS 5-2.5-18.5 LF-MCG/0.5 IM SUSP: 0.5000 mL | Freq: Once | INTRAMUSCULAR | Status: DC

## 2019-11-29 MED ORDER — NALBUPHINE HCL 10 MG/ML IJ SOLN: 5.0000 mg | Freq: Once | INTRAMUSCULAR | Status: DC | PRN

## 2019-11-29 MED ORDER — KETOROLAC TROMETHAMINE 30 MG/ML IJ SOLN: 30.0000 mg | Freq: Four times a day (QID) | INTRAMUSCULAR | Status: AC | PRN

## 2019-11-29 MED ORDER — SCOPOLAMINE 1 MG/3DAYS TD PT72
1.0000 | MEDICATED_PATCH | Freq: Once | TRANSDERMAL | Status: DC
  Administered 2019-11-29: 1.5 mg via TRANSDERMAL

## 2019-11-29 MED ORDER — DEXAMETHASONE SODIUM PHOSPHATE 4 MG/ML IJ SOLN
INTRAMUSCULAR | Status: AC
  Filled 2019-11-29: qty 1

## 2019-11-29 MED ORDER — FENTANYL CITRATE (PF) 100 MCG/2ML IJ SOLN
INTRAMUSCULAR | Status: DC | PRN
Start: 2019-11-29 — End: 2019-11-29
  Administered 2019-11-29: 15 ug via INTRATHECAL

## 2019-11-29 MED ORDER — DEXAMETHASONE SODIUM PHOSPHATE 4 MG/ML IJ SOLN
INTRAMUSCULAR | Status: DC | PRN
  Administered 2019-11-29: 4 mg via INTRAVENOUS

## 2019-11-29 MED ORDER — ACETAMINOPHEN 500 MG PO TABS: 1000.0000 mg | ORAL_TABLET | Freq: Four times a day (QID) | ORAL | Status: DC

## 2019-11-29 MED ORDER — PRENATAL MULTIVITAMIN CH
1.0000 | ORAL_TABLET | Freq: Every day | ORAL | Status: DC
  Administered 2019-11-30 – 2019-12-01 (×2): 1 via ORAL
  Filled 2019-11-29 (×2): qty 1

## 2019-11-29 MED ORDER — KETOROLAC TROMETHAMINE 30 MG/ML IJ SOLN
INTRAMUSCULAR | Status: AC
  Filled 2019-11-29: qty 1

## 2019-11-29 MED ORDER — COCONUT OIL OIL: 1.0000 "application " | TOPICAL_OIL | Status: DC | PRN

## 2019-11-29 MED ORDER — ACETAMINOPHEN 10 MG/ML IV SOLN
INTRAVENOUS | Status: AC
  Filled 2019-11-29: qty 100

## 2019-11-29 MED ORDER — ONDANSETRON HCL 4 MG/2ML IJ SOLN
INTRAMUSCULAR | Status: DC | PRN
  Administered 2019-11-29: 4 mg via INTRAVENOUS

## 2019-11-29 MED ORDER — OXYCODONE HCL 5 MG PO TABS: 5.0000 mg | ORAL_TABLET | ORAL | Status: DC | PRN

## 2019-11-29 MED ORDER — MENTHOL 3 MG MT LOZG: 1.0000 | LOZENGE | OROMUCOSAL | Status: DC | PRN

## 2019-11-29 MED ORDER — BUPIVACAINE IN DEXTROSE 0.75-8.25 % IT SOLN
INTRATHECAL | Status: DC | PRN
  Administered 2019-11-29: 1.8 mL via INTRATHECAL

## 2019-11-29 MED ORDER — ONDANSETRON HCL 4 MG/2ML IJ SOLN: 4.0000 mg | Freq: Three times a day (TID) | INTRAMUSCULAR | Status: DC | PRN

## 2019-11-29 MED ORDER — WITCH HAZEL-GLYCERIN EX PADS: 1.0000 "application " | MEDICATED_PAD | CUTANEOUS | Status: DC | PRN

## 2019-11-29 MED ORDER — ACETAMINOPHEN 500 MG PO TABS
1000.0000 mg | ORAL_TABLET | Freq: Three times a day (TID) | ORAL | Status: DC
  Administered 2019-11-30 – 2019-12-01 (×4): 1000 mg via ORAL
  Filled 2019-11-29 (×5): qty 2

## 2019-11-29 MED ORDER — DIPHENHYDRAMINE HCL 25 MG PO CAPS: 25.0000 mg | ORAL_CAPSULE | ORAL | Status: DC | PRN

## 2019-11-29 MED ORDER — NALBUPHINE HCL 10 MG/ML IJ SOLN: 5.0000 mg | INTRAMUSCULAR | Status: DC | PRN

## 2019-11-29 MED ORDER — ACETAMINOPHEN 10 MG/ML IV SOLN
1000.0000 mg | Freq: Once | INTRAVENOUS | Status: DC | PRN
  Administered 2019-11-29: 1000 mg via INTRAVENOUS

## 2019-11-29 MED ORDER — LACTATED RINGERS IV SOLN: INTRAVENOUS | Status: DC

## 2019-11-29 MED ORDER — ZOLPIDEM TARTRATE 5 MG PO TABS: 5.0000 mg | ORAL_TABLET | Freq: Every evening | ORAL | Status: DC | PRN

## 2019-11-29 MED ORDER — DIBUCAINE (PERIANAL) 1 % EX OINT: 1.0000 "application " | TOPICAL_OINTMENT | CUTANEOUS | Status: DC | PRN

## 2019-11-29 MED ORDER — SOD CITRATE-CITRIC ACID 500-334 MG/5ML PO SOLN
30.0000 mL | ORAL | Status: AC
  Administered 2019-11-29: 30 mL via ORAL

## 2019-11-29 MED ORDER — OXYTOCIN-SODIUM CHLORIDE 30-0.9 UT/500ML-% IV SOLN
INTRAVENOUS | Status: DC | PRN
  Administered 2019-11-29: 400 mL via INTRAVENOUS

## 2019-11-29 MED ORDER — MORPHINE SULFATE (PF) 0.5 MG/ML IJ SOLN
INTRAMUSCULAR | Status: AC
  Filled 2019-11-29: qty 10

## 2019-11-29 MED ORDER — CEFAZOLIN SODIUM-DEXTROSE 2-4 GM/100ML-% IV SOLN
2.0000 g | INTRAVENOUS | Status: AC
  Administered 2019-11-29: 2 g via INTRAVENOUS

## 2019-11-29 MED ORDER — NALOXONE HCL 4 MG/10ML IJ SOLN
1.0000 ug/kg/h | INTRAVENOUS | Status: DC | PRN
  Filled 2019-11-29: qty 5

## 2019-11-29 MED ORDER — OXYTOCIN-SODIUM CHLORIDE 30-0.9 UT/500ML-% IV SOLN
INTRAVENOUS | Status: AC
  Filled 2019-11-29: qty 500

## 2019-11-29 MED ORDER — PHENYLEPHRINE HCL-NACL 20-0.9 MG/250ML-% IV SOLN
INTRAVENOUS | Status: DC | PRN
  Administered 2019-11-29: 60 ug/min via INTRAVENOUS

## 2019-11-29 MED ORDER — DIPHENHYDRAMINE HCL 25 MG PO CAPS: 25.0000 mg | ORAL_CAPSULE | Freq: Four times a day (QID) | ORAL | Status: DC | PRN

## 2019-11-29 MED ORDER — FENTANYL CITRATE (PF) 100 MCG/2ML IJ SOLN
INTRAMUSCULAR | Status: AC
  Filled 2019-11-29: qty 2

## 2019-11-29 MED ORDER — SIMETHICONE 80 MG PO CHEW
80.0000 mg | CHEWABLE_TABLET | Freq: Three times a day (TID) | ORAL | Status: DC
  Administered 2019-11-30 – 2019-12-01 (×3): 80 mg via ORAL
  Filled 2019-11-29 (×4): qty 1

## 2019-11-29 MED ORDER — SIMETHICONE 80 MG PO CHEW: 80.0000 mg | CHEWABLE_TABLET | ORAL | Status: DC | PRN

## 2019-11-29 MED ORDER — DIPHENHYDRAMINE HCL 50 MG/ML IJ SOLN
INTRAMUSCULAR | Status: AC
  Filled 2019-11-29: qty 1

## 2019-11-29 MED ORDER — KETOROLAC TROMETHAMINE 30 MG/ML IJ SOLN
30.0000 mg | Freq: Once | INTRAMUSCULAR | Status: AC | PRN
  Administered 2019-11-29: 30 mg via INTRAVENOUS

## 2019-11-29 MED ORDER — NALOXONE HCL 0.4 MG/ML IJ SOLN: 0.4000 mg | INTRAMUSCULAR | Status: DC | PRN

## 2019-11-29 MED ORDER — SCOPOLAMINE 1 MG/3DAYS TD PT72
MEDICATED_PATCH | TRANSDERMAL | Status: AC
  Filled 2019-11-29: qty 1

## 2019-11-29 MED ORDER — SIMETHICONE 80 MG PO CHEW
80.0000 mg | CHEWABLE_TABLET | ORAL | Status: DC
  Administered 2019-11-30 (×2): 80 mg via ORAL
  Filled 2019-11-29 (×2): qty 1

## 2019-11-29 MED ORDER — SOD CITRATE-CITRIC ACID 500-334 MG/5ML PO SOLN
ORAL | Status: AC
  Filled 2019-11-29: qty 30

## 2019-11-29 MED ORDER — SENNOSIDES-DOCUSATE SODIUM 8.6-50 MG PO TABS
2.0000 | ORAL_TABLET | ORAL | Status: DC
  Administered 2019-11-30 (×2): 2 via ORAL
  Filled 2019-11-29 (×2): qty 2

## 2019-11-29 MED ORDER — KETOROLAC TROMETHAMINE 30 MG/ML IJ SOLN
30.0000 mg | Freq: Four times a day (QID) | INTRAMUSCULAR | Status: AC | PRN
  Administered 2019-11-30: 30 mg via INTRAVENOUS
  Filled 2019-11-29: qty 1

## 2019-11-29 MED ORDER — FENTANYL CITRATE (PF) 100 MCG/2ML IJ SOLN: 25.0000 ug | INTRAMUSCULAR | Status: DC | PRN

## 2019-11-29 MED ORDER — CEFAZOLIN SODIUM-DEXTROSE 2-4 GM/100ML-% IV SOLN
INTRAVENOUS | Status: AC
  Filled 2019-11-29: qty 100

## 2019-11-29 MED ORDER — SODIUM CHLORIDE 0.9% FLUSH: 3.0000 mL | INTRAVENOUS | Status: DC | PRN

## 2019-11-29 MED ORDER — MORPHINE SULFATE (PF) 0.5 MG/ML IJ SOLN
INTRAMUSCULAR | Status: DC | PRN
Start: 2019-11-29 — End: 2019-11-29
  Administered 2019-11-29: .15 mg via INTRATHECAL

## 2019-11-29 MED ORDER — MEASLES, MUMPS & RUBELLA VAC IJ SOLR: 0.5000 mL | Freq: Once | INTRAMUSCULAR | Status: DC

## 2019-11-29 MED ORDER — DIPHENHYDRAMINE HCL 50 MG/ML IJ SOLN
12.5000 mg | INTRAMUSCULAR | Status: DC | PRN
  Administered 2019-11-29: 12.5 mg via INTRAVENOUS

## 2019-11-29 MED ORDER — ONDANSETRON HCL 4 MG/2ML IJ SOLN
INTRAMUSCULAR | Status: AC
  Filled 2019-11-29: qty 2

## 2019-11-29 SURGICAL SUPPLY — 44 items
APL SKNCLS STERI-STRIP NONHPOA (GAUZE/BANDAGES/DRESSINGS) ×1
BENZOIN TINCTURE PRP APPL 2/3 (GAUZE/BANDAGES/DRESSINGS) ×3 IMPLANT
CANISTER SUCT 3000ML PPV (MISCELLANEOUS) ×3 IMPLANT
CHLORAPREP W/TINT 26ML (MISCELLANEOUS) ×3 IMPLANT
CLOSURE STERI-STRIP 1/2X4 (GAUZE/BANDAGES/DRESSINGS) ×1
CLOSURE WOUND 1/2 X4 (GAUZE/BANDAGES/DRESSINGS) ×1
CLSR STERI-STRIP ANTIMIC 1/2X4 (GAUZE/BANDAGES/DRESSINGS) ×1 IMPLANT
DRSG OPSITE POSTOP 4X10 (GAUZE/BANDAGES/DRESSINGS) ×3 IMPLANT
ELECT REM PT RETURN 9FT ADLT (ELECTROSURGICAL) ×3
ELECTRODE REM PT RTRN 9FT ADLT (ELECTROSURGICAL) ×1 IMPLANT
EXTRACTOR VACUUM KIWI (MISCELLANEOUS) ×3 IMPLANT
GLOVE BIOGEL PI IND STRL 7.0 (GLOVE) ×2 IMPLANT
GLOVE BIOGEL PI INDICATOR 7.0 (GLOVE) ×4
GLOVE INDICATOR 7.5 STRL GRN (GLOVE) ×3 IMPLANT
GLOVE SKINSENSE NS SZ7.0 (GLOVE) ×2
GLOVE SKINSENSE STRL SZ7.0 (GLOVE) ×1 IMPLANT
GOWN STRL REUS W/ TWL LRG LVL3 (GOWN DISPOSABLE) ×2 IMPLANT
GOWN STRL REUS W/ TWL XL LVL3 (GOWN DISPOSABLE) ×1 IMPLANT
GOWN STRL REUS W/TWL LRG LVL3 (GOWN DISPOSABLE) ×6
GOWN STRL REUS W/TWL XL LVL3 (GOWN DISPOSABLE) ×3
NS IRRIG 1000ML POUR BTL (IV SOLUTION) ×3 IMPLANT
PACK C SECTION WH (CUSTOM PROCEDURE TRAY) ×3 IMPLANT
PAD ABD 7.5X8 STRL (GAUZE/BANDAGES/DRESSINGS) ×3 IMPLANT
PAD OB MATERNITY 4.3X12.25 (PERSONAL CARE ITEMS) ×3 IMPLANT
PAD PREP 24X48 CUFFED NSTRL (MISCELLANEOUS) ×3 IMPLANT
PENCIL SMOKE EVAC W/HOLSTER (ELECTROSURGICAL) ×3 IMPLANT
STRIP CLOSURE SKIN 1/2X4 (GAUZE/BANDAGES/DRESSINGS) ×2 IMPLANT
SUT MNCRL 0 VIOLET CTX 36 (SUTURE) ×2 IMPLANT
SUT MON AB 4-0 PS1 27 (SUTURE) ×3 IMPLANT
SUT MONOCRYL 0 CTX 36 (SUTURE) ×6
SUT PLAIN 0 NONE (SUTURE) IMPLANT
SUT PLAIN 2 0 XLH (SUTURE) ×3 IMPLANT
SUT VIC AB 0 CT1 27 (SUTURE) ×6
SUT VIC AB 0 CT1 27XBRD ANBCTR (SUTURE) ×2 IMPLANT
SUT VIC AB 2-0 CT1 (SUTURE) ×2 IMPLANT
SUT VIC AB 2-0 CT1 27 (SUTURE) ×6
SUT VIC AB 2-0 CT1 TAPERPNT 27 (SUTURE) IMPLANT
SUT VIC AB 3-0 CT1 27 (SUTURE) ×3
SUT VIC AB 3-0 CT1 TAPERPNT 27 (SUTURE) ×1 IMPLANT
SUT VIC AB 4-0 KS 27 (SUTURE) ×2 IMPLANT
SUT VICRYL 2 0 18  TIES (SUTURE)
SUT VICRYL 2 0 18 TIES (SUTURE) IMPLANT
TOWEL OR 17X24 6PK STRL BLUE (TOWEL DISPOSABLE) ×6 IMPLANT
WATER STERILE IRR 1000ML POUR (IV SOLUTION) ×3 IMPLANT

## 2019-11-29 NOTE — Op Note (Addendum)
Operative Note   SURGERY DATE: 11/29/2019  PRE-OP DIAGNOSIS:  *Pregnancy at [redacted]w[redacted]d *History of cesarean section x 2 and desire for repeat *GDMA1 *Desire for permanent sterilization  POST-OP DIAGNOSIS: Same. Delivered  PROCEDURE: Repeat low transverse cesarean section via pfannenstiel skin incision with double layer uterine closure and bilateral tubal ligation via [place] Method. Removal of left para-tubal cyst  SURGEON: Surgeon(s) and Role:    * Delanson Bing, MD - Primary    * Marsala, Arlana Pouch, MD - Assisting  ANESTHESIA: spinal  ESTIMATED BLOOD LOSS:  DRAIN S: 200 mL UOP via indwelling foley  TOTAL IV FLUIDS: 1000 mL crystalloid  VTE PROPHYLAXIS: SCDs to bilateral lower extremities  ANTIBIOTICS: Two grams of Cefazolin were given., within 1 hour of skin incision  SPECIMENS: portions of bilateral tubes and left paratubal cyst  COMPLICATIONS: none  FINDINGS: Of note, there were no intra-abdominal adhesions seen.  Grossly normal uterus, tubes and ovaries. clear amniotic fluid, cephalic female infant, weight 3055gm, APGARs 9/9, intact placenta. 1.5cm benign appearing left paratubal cyst near the fimbriae.   PROCEDURE IN DETAIL: The patient was taken to the operating room where anesthesia was administered and normal fetal heart tones were confirmed. She was then prepped and draped in the normal fashion in the dorsal supine position with a leftward tilt.  After a time out was performed, a pfannensteil skin incision was made with the scalpel and carried through to the underlying layer of fascia. The fascia was then incised at the midline and this incision was extended laterally with the mayo scissors. Attention was turned to the superior aspect of the fascial incision which was grasped with the kocher clamps x 2, tented up and the rectus muscles were dissected off with the scalpel. In a similar fashion the inferior aspect of the fascial incision was grasped with the kocher  clamps, tented up and the rectus muscles dissected off with the mayo scissors. The rectus muscles were then separated in the midline and the peritoneum was entered bluntly. The bladder blade was inserted and the vesicouterine peritoneum was identified, tented up and entered with the metzenbaum scissors. This incision was extended laterally and the bladder flap was created digitally. The bladder blade was reinserted.  A low transverse hysterotomy was made with the scalpel until the endometrial cavity was breached and the amniotic sac ruptured, yielding clear amniotic fluid. This incision was extended bluntly and the infant's head, shoulders and body were delivered atraumatically.The cord was clamped x 2 and cut, and the infant was handed to the awaiting pediatricians, after delayed cord clamping was done.  The placenta was then gradually expressed from the uterus and then the uterus was exteriorized and cleared of all clots and debris. The hysterotomy was repaired with a running suture of 1-0 monocryl. A second imbricating layer of 1-0 monocryl suture was then placed. Excellent hemostasis was noted.  The left Fallopian tube was identified by tracing out to the fimbraie, grasped with the Babcock clamps. An avascular midsection of the tube approximately 3-4cm from the cornua was grasped with the babcock clamps and the distal and proximal aspects were ligated with a suture of vicryl, with the intervening portion of tube was transected and removed, via the Metzenbaum scissors. Another suture tie was then placed below both stumps.  Attention was then turned to the right fallopian tube after confirmation by tracing the tube out to the fimbriae. The same procedure was then performed on the right Fallopian tube, with excellent hemostasis was noted  from both BTL sites. Two kelly clamps placed below the left paratubal cyst and cut and removed. 2-0 vicryl fore and aft stitch done and clamp removed. Next 2-0 vicryl free tie  placed below other clamp and clamp removed.    The uterus and adnexa were then returned to the abdomen, and the hysterotomy and all operative sites were reinspected and excellent hemostasis was noted after irrigation and suction of the abdomen with warm saline.   The peritoneum was closed with a running stitch of 3-0 Vicryl. The fascia was reapproximated with 0 Vicryl in a simple running fashion bilaterally. The subcutaneous layer was then reapproximated with interrupted sutures of 2-0 plain gut, and the skin was then closed with 4-0 monocryl, in a subcuticular fashion.  The patient  tolerated the procedure well. Sponge, lap, needle, and instrument counts were correct x 2. The patient was transferred to the recovery room awake, alert and breathing independently in stable condition.   Casper Harrison, MD [facility] Fellow, [facility] for [facility], [facility]    Agree with above. I was present and scrubbed for the entire procedure.   Cornelia Copa MD Attending Center for [employer] Midwife)

## 2019-11-29 NOTE — Lactation Note (Signed)
This note was copied from a baby's chart. Lactation Consultation Note  Patient Name: Natalie Snow [id number] Date: 11/29/2019 Reason for consult: Initial assessment;Term  Mom is A p3 G3.  Mom speaks spanish but dad speaks english and spanish, but mom preferred interpreter.   Mom breastfeeding on arrival.  Via [place] interpreter, mom reports comfort.  Asked mom if I could show her something, she agreed.  Showed her how to do a gentle chin tug and get hm in closer. Asked mom if anyone had taught her hand expression.  Mom reported no.   Mom able to hand express easily on right breast,  Baby feeding on left breast in cradle hold on arrival.  Mom reports she will be staying home with this baby.  Discussed exclusive breastfeeding.  Mom repots she does not have a pump at Home.  Gave mom manual pump and demo how to use it.   Praised breastfeeding.  Reviewed Understanding Mother and baby and Gave [facility] breastfeeding Consulation services handouts.   Urged mom to call lactation as needed. Foy Mungia 11/29/2019, 11:21 PM

## 2019-11-29 NOTE — Transfer of Care (Signed)
Immediate Anesthesia Transfer of Care Note  Patient: Natalie Snow  Procedure(s) Performed: CESAREAN SECTION WITH BILATERAL TUBAL LIGATION (N/A )  Patient Location: PACU  Anesthesia Type:General  Level of Consciousness: awake, alert  and patient cooperative  Airway & Oxygen Therapy: Patient Spontanous Breathing  Post-op Assessment: Report given to RN and Post -op Vital signs reviewed and stable  Post vital signs: Reviewed and stable  Last Vitals:  Vitals Value Taken Time  BP    Temp    Pulse    Resp 13 11/29/19 1412  SpO2    Vitals shown include unvalidated device data.  Last Pain:  Vitals:   11/29/19 0811  TempSrc: Oral     SEE PACU FLOW SHEET FOR VITALS.    Complications: No complications documented.

## 2019-11-29 NOTE — Anesthesia Procedure Notes (Signed)
Spinal  Patient location during procedure: OR Start time: 11/29/2019 12:35 PM End time: 11/29/2019 12:45 PM Staffing Performed: anesthesiologist  Anesthesiologist: Elmer Picker, MD Preanesthetic Checklist Completed: patient identified, IV checked, risks and benefits discussed, surgical consent, monitors and equipment checked, pre-op evaluation and timeout performed Spinal Block Patient position: sitting Prep: DuraPrep and site prepped and draped Patient monitoring: cardiac monitor, continuous pulse ox and blood pressure Approach: midline Location: L3-4 Injection technique: single-shot Needle Needle type: Pencan  Needle gauge: 24 G Needle length: 9 cm Assessment Sensory level: T6 Additional Notes Functioning IV was confirmed and monitors were applied. Sterile prep and drape, including hand hygiene and sterile gloves were used. The patient was positioned and the spine was prepped. The skin was anesthetized with lidocaine.  Free flow of clear CSF was obtained prior to injecting local anesthetic into the CSF.  The spinal needle aspirated freely following injection.  The needle was carefully withdrawn.  The patient tolerated the procedure well.

## 2019-11-29 NOTE — Anesthesia Postprocedure Evaluation (Signed)
Anesthesia Post Note  Patient: Natalie Snow  Procedure(s) Performed: CESAREAN SECTION WITH BILATERAL TUBAL LIGATION (N/A )     Patient location during evaluation: PACU Anesthesia Type: Spinal Level of consciousness: oriented and awake and alert Pain management: pain level controlled Vital Signs Assessment: post-procedure vital signs reviewed and stable Respiratory status: spontaneous breathing, respiratory function stable and patient connected to nasal cannula oxygen Cardiovascular status: blood pressure returned to baseline and stable Postop Assessment: no headache, no backache and no apparent nausea or vomiting Anesthetic complications: no   No complications documented.  Last Vitals:  Vitals:   11/29/19 1515 11/29/19 1528  BP: 114/77 110/74  Pulse: 67 65  Resp: 16 18  Temp: 36.6 C (!) 36.4 C  SpO2: 98% 99%    Last Pain:  Vitals:   11/29/19 1528  TempSrc: Oral   Pain Goal:    LLE Motor Response: Purposeful movement (11/29/19 1515) LLE Sensation: Tingling (11/29/19 1515) RLE Motor Response: Purposeful movement (11/29/19 1515) RLE Sensation: Tingling (11/29/19 1515)     Epidural/Spinal Function Cutaneous sensation: Tingles (11/29/19 1515), Patient able to flex knees: Yes (11/29/19 1515), Patient able to lift hips off bed: No (11/29/19 1515), Back pain beyond tenderness at insertion site: No (11/29/19 1515), Progressively worsening motor and/or sensory loss: No (11/29/19 1515), Bowel and/or bladder incontinence post epidural: No (11/29/19 1515)  Caswell Alvillar L Karlea Mckibbin

## 2019-11-29 NOTE — H&P (Addendum)
Obstetrics Admission History & Physical  11/29/2019 - 8:56 AM Primary OBGYN: [facility]  Chief Complaint: scheduled rpt c-section and desire for BTL  History of Present Illness  34 y.o. [id number] @ [redacted]w[redacted]d, with the above CC. Pregnancy complicated by: GDMa1, BMI 30, h/o c-section x 2, AMA  [name] states that she is doing well and no labor or decreased FM s/s.  Review of Systems:  as noted in the History of Present Illness.  PMHx:  Past Medical History:  Diagnosis Date   Gestational diabetes    Headache    Medical history non-contributory    UTI (urinary tract infection)    PSHx:  Past Surgical History:  Procedure Laterality Date   CESAREAN SECTION     x2   2004, 2010   Medications:  Medications Prior to Admission  Medication Sig Dispense Refill Last Dose   Magnesium Oxide 200 MG TABS Take 1 tablet (200 mg total) by mouth daily. (Patient not taking: Reported on 11/25/2019) 90 tablet 1    omeprazole (PRILOSEC) 20 MG capsule Take 1 capsule (20 mg total) by mouth 2 (two) times daily before a meal. (Patient taking differently: Take 20 mg by mouth daily as needed (Heartburn). ) 60 capsule 6    Prenatal Vit-Fe Fumarate-FA (MULTIVITAMIN-PRENATAL) 27-0.8 MG TABS tablet Take 1 tablet by mouth daily at 12 noon. 30 tablet 0 11/28/2019 at Unknown time   Accu-Chek Softclix Lancets lancets Use as instructed; please check blood glucose 4 times daily 100 each 12    cyclobenzaprine (FLEXERIL) 10 MG tablet Take 1 tablet (10 mg total) by mouth 3 (three) times daily as needed for muscle spasms (Headache). (Patient not taking: Reported on 09/01/2019) 30 tablet 2    glucose blood (ACCU-CHEK GUIDE) test strip Use as instructed; please check blood glucose 4 times daily 100 each 12    metoCLOPramide (REGLAN) 10 MG tablet Take 1 tablet (10 mg total) by mouth every 8 (eight) hours as needed for nausea or vomiting (Headache). (Patient not taking: Reported on  08/05/2019) 30 tablet 2      Allergies: has No Known Allergies. OBHx:  OB History  Gravida Para Term Preterm AB Living  4 2 2   1 2   SAB TAB Ectopic Multiple Live Births  1       2    # Outcome Date GA Lbr Len/2nd Weight Sex Delivery Anes PTL Lv  4 Current           3 SAB 10/2018 [redacted]w[redacted]d         2 Term 03/28/08 [redacted]w[redacted]d  3685 g F CS-LTranv   LIV     Birth Comments: repeat c/s  1 Term 11/19/02 [redacted]w[redacted]d  3685 g M CS-LTranv   LIV     Birth Comments: C/S NRFHR; pregnancy wnl     Complications: Fetal Intolerance              FHx:  Family History  Problem Relation Age of Onset   Thyroid disease Mother    Thyroid disease Sister    Soc Hx:  Social History   Socioeconomic History   Marital status: Single    Spouse name: Not on file   Number of children: Not on file   Years of education: Not on file   Highest education level: Not on file  Occupational History   Not on file  Tobacco Use   Smoking status: Never Smoker   Smokeless tobacco: Never Used  Vaping  Use   Vaping Use: Never used  Substance and Sexual Activity   Alcohol use: Not Currently    Comment: occasionally   Drug use: Never   Sexual activity: Yes    Birth control/protection: None  Other Topics Concern   Not on file  Social History Narrative   Not on file   Social Determinants of Health   Financial Resource Strain:    Difficulty of Paying Living Expenses: Not on file  Food Insecurity: No Food Insecurity   Worried About Running Out of Food in the Last Year: Never true   Ran Out of Food in the Last Year: Never true  Transportation Needs: No Transportation Needs   Lack of Transportation (Medical): No   Lack of Transportation (Non-Medical): No  Physical Activity:    Days of Exercise per Week: Not on file   Minutes of Exercise per Session: Not on file  Stress:    Feeling of Stress : Not on file  Social Connections:    Frequency of Communication with Friends and Family: Not on file    Frequency of Social Gatherings with Friends and Family: Not on file   Attends Religious Services: Not on file   Active Member of Clubs or Organizations: Not on file   Attends Banker Meetings: Not on file   Marital Status: Not on file  Intimate Partner Violence:    Fear of Current or Ex-Partner: Not on file   Emotionally Abused: Not on file   Physically Abused: Not on file   Sexually Abused: Not on file    Objective    Current Vital Signs 24h Vital Sign Ranges  T 98.1 F (36.7 C) Temp  Avg: 98.1 F (36.7 C)  Min: 98.1 F (36.7 C)  Max: 98.1 F (36.7 C)  BP 130/85 BP  Min: 130/85  Max: 130/85  HR 93 Pulse  Avg: 93  Min: 93  Max: 93  RR 18 Resp  Avg: 18  Min: 18  Max: 18  SaO2 99 % Room Air SpO2  Avg: 99 %  Min: 99 %  Max: 99 %       24 Hour I/O Current Shift I/O  Time Ins Outs No intake/output data recorded. No intake/output data recorded.   EFM: 150s   General: Well nourished, well developed female in no acute distress.  Skin:  Warm and dry.  Cardiovascular: S1, S2 normal, no murmur, rub or gallop, regular rate and rhythm Respiratory:  Clear to auscultation bilateral. Normal respiratory effort Abdomen: gravid, nttp Neuro/Psych:  Normal mood and affect.   Labs  CBG: 104  Recent Labs  Lab 11/27/19 0943  WBC 7.2  HGB 12.7  HCT 39.2  PLT 174   COVID: neg A POS  Radiology 9/9: 62%, 3176gm, AC 88%, AFI 11, placenta anterior.   Assessment & Plan   34 y.o. [id number] @ [redacted]w[redacted]d doing well D/w her and she desires rpt c/s. Prior op note reviewed and no e/o scar tissue. I also d/w her re: permanency of BTL and desires to have this done too. BTL papers are UTD. Can proceed when OR is ready  Interpreter used.   Cornelia Copa MD Attending Center for [facility])

## 2019-11-29 NOTE — Discharge Summary (Signed)
Postpartum Discharge Summar     Patient Name: Natalie Snow DOB: 04/23/1985 MRN: [id number]  Date of admission: 11/29/2019 Delivery date:11/29/2019  Delivering provider: Aletha Halim  Date of discharge: 12/01/2019  Admitting diagnosis: Pregnancy [Z34.90] Intrauterine pregnancy: [redacted]w[redacted]d    Secondary diagnosis:  Active Problems:   Short interval between pregnancies affecting pregnancy, antepartum   Unwanted fertility   Diet controlled gestational diabetes mellitus (GDM) in third trimester   Pregnancy  Additional problems: None     Discharge diagnosis: Term Pregnancy Delivered and GDM A2                                              Post partum procedures:postpartum tubal ligation Augmentation: N/A Complications: None  Hospital course: Sceduled C/S   34y.o. yo [id number] admitted to the hospital 11/29/2019 for scheduled cesarean section with the following indication:Elective Repeat.Delivery details are as follows:  Membrane Rupture Time/Date: 1:10 PM ,11/29/2019   Delivery Method:C-Section, Low Transverse  Details of operation can be found in separate operative note.  Patient had an uncomplicated postpartum course.  She is ambulating, tolerating a regular diet, passing flatus, and urinating well. Patient is discharged home in stable condition on  12/01/19        Newborn Data: Birth date:11/29/2019  Birth time:1:10 PM  Gender:Female  Living status:Living  Apgars:9 ,9  Weight:3055 g     Magnesium Sulfate received: No BMZ received: No Rhophylac:N/A MMR:N/A T-DaP:Given prenatally Flu: No Transfusion:No  Physical exam  Vitals:   11/30/19 1120 11/30/19 1745 11/30/19 2020 12/01/19 0505  BP: 109/64 111/70 112/65 121/83  Pulse: 73 76 70 81  Resp: 16 18 18 18   Temp: 98.3 F (36.8 C) 98 F (36.7 C) 97.6 F (36.4 C) 98.2 F (36.8 C)  TempSrc: Oral Oral Oral Oral  SpO2: 97%  99% 98%  Weight:      Height:       General: alert and cooperative Lochia:  appropriate Uterine Fundus: firm Incision: Healing well with no significant drainage, Dressing is clean, dry, and intact DVT Evaluation: No evidence of DVT seen on physical exam. Labs: Lab Results  Component Value Date   WBC 9.1 11/30/2019   HGB 9.3 (L) 11/30/2019   HCT 29.4 (L) 11/30/2019   MCV 89.6 11/30/2019   PLT 145 (L) 11/30/2019   CMP Latest Ref Rng & Units 11/29/2019  Glucose 65 - 99 mg/dL -  BUN 6 - 20 mg/dL -  Creatinine 0.44 - 1.00 mg/dL 0.64  Sodium 134 - 144 mmol/L -  Potassium 3.5 - 5.2 mmol/L -  Chloride 96 - 106 mmol/L -  CO2 20 - 29 mmol/L -  Calcium 8.7 - 10.2 mg/dL -  Total Protein 6.0 - 8.5 g/dL -  Total Bilirubin 0.0 - 1.2 mg/dL -  Alkaline Phos 48 - 121 IU/L -  AST 0 - 40 IU/L -  ALT 0 - 32 IU/L -   Edinburgh Score: Edinburgh Postnatal Depression Scale Screening Tool 12/01/2019  I have been able to laugh and see the funny side of things. (No Data)     After visit meds:  Allergies as of 12/01/2019   No Known Allergies     Medication List    STOP taking these medications   cyclobenzaprine 10 MG tablet Commonly known as: FLEXERIL   metoCLOPramide 10 MG  tablet Commonly known as: REGLAN     TAKE these medications   Accu-Chek Guide test strip Generic drug: glucose blood Use as instructed; please check blood glucose 4 times daily   Accu-Chek Softclix Lancets lancets Use as instructed; please check blood glucose 4 times daily   ferrous sulfate 324 (65 Fe) MG Tbec Take 1 tablet (325 mg total) by mouth every other day.   ibuprofen 600 MG tablet Commonly known as: ADVIL Take 1 tablet (600 mg total) by mouth every 6 (six) hours.   Magnesium Oxide 200 MG Tabs Take 1 tablet (200 mg total) by mouth daily.   multivitamin-prenatal 27-0.8 MG Tabs tablet Take 1 tablet by mouth daily at 12 noon.   omeprazole 20 MG capsule Commonly known as: PRILOSEC Take 1 capsule (20 mg total) by mouth 2 (two) times daily before a meal. What changed:   when  to take this  reasons to take this   oxyCODONE 5 MG immediate release tablet Commonly known as: Oxy IR/ROXICODONE Take 1-2 tablets (5-10 mg total) by mouth every 4 (four) hours as needed for moderate pain.   senna-docusate 8.6-50 MG tablet Commonly known as: Senokot-S Take 2 tablets by mouth daily. Start taking on: December 02, 2019        Discharge home in stable condition Infant Feeding: Bottle and Breast Infant Disposition:home with mother Discharge instruction: per After Visit Summary and Postpartum booklet. Activity: Advance as tolerated. Pelvic rest for 6 weeks.  Diet: routine diet Future Appointments: Future Appointments  Date Time Provider [place]  12/06/2019  2:00 PM [facility] NURSE [facility]  12/27/2019  8:50 AM [facility]  12/27/2019 10:55 AM Woodroe Mode, MD [facility]   Follow up Visit:   Please schedule this patient for a In person postpartum visit in 6 weeks with the following provider: MD. Additional Postpartum F/U:2 hour GTT at 6 weeks and Incision check 1 week  High risk pregnancy complicated by: GDM Delivery mode:  C-Section, Low Transverse  Anticipated Birth Control:  BTL done PP   12/01/2019 Melina Schools, DO

## 2019-11-29 NOTE — Anesthesia Preprocedure Evaluation (Addendum)
Anesthesia Evaluation  Patient identified by MRN, date of birth, ID band Patient awake    Reviewed: Allergy & Precautions, NPO status , Patient's Chart, lab work & pertinent test results  Airway Mallampati: II  TM Distance: >3 FB Neck ROM: Full    Dental no notable dental hx.    Pulmonary neg pulmonary ROS,    Pulmonary exam normal breath sounds clear to auscultation       Cardiovascular negative cardio ROS Normal cardiovascular exam Rhythm:Regular Rate:Normal     Neuro/Psych  Headaches, negative psych ROS   GI/Hepatic negative GI ROS, Neg liver ROS,   Endo/Other  negative endocrine ROSdiabetes, Well Controlled, Gestational  Renal/GU negative Renal ROS  negative genitourinary   Musculoskeletal negative musculoskeletal ROS (+)   Abdominal   Peds  Hematology negative hematology ROS (+)   Anesthesia Other Findings   Reproductive/Obstetrics (+) Pregnancy                            Anesthesia Physical Anesthesia Plan  ASA: II  Anesthesia Plan: Spinal   Post-op Pain Management:    Induction:   PONV Risk Score and Plan: Treatment may vary due to age or medical condition  Airway Management Planned: Natural Airway  Additional Equipment:   Intra-op Plan:   Post-operative Plan:   Informed Consent: I have reviewed the patients History and Physical, chart, labs and discussed the procedure including the risks, benefits and alternatives for the proposed anesthesia with the patient or authorized representative who has indicated his/her understanding and acceptance.     Dental advisory given  Plan Discussed with: CRNA  Anesthesia Plan Comments:         Anesthesia Quick Evaluation

## 2019-11-30 ENCOUNTER — Encounter (HOSPITAL_COMMUNITY): Payer: Self-pay | Admitting: Obstetrics and Gynecology

## 2019-11-30 LAB — CBC
HCT: 29.4 % — ABNORMAL LOW (ref 36.0–46.0)
Hemoglobin: 9.3 g/dL — ABNORMAL LOW (ref 12.0–15.0)
MCH: 28.4 pg (ref 26.0–34.0)
MCHC: 31.6 g/dL (ref 30.0–36.0)
MCV: 89.6 fL (ref 80.0–100.0)
Platelets: 145 10*3/uL — ABNORMAL LOW (ref 150–400)
RBC: 3.28 MIL/uL — ABNORMAL LOW (ref 3.87–5.11)
RDW: 13.5 % (ref 11.5–15.5)
WBC: 9.1 10*3/uL (ref 4.0–10.5)
nRBC: 0 % (ref 0.0–0.2)

## 2019-11-30 LAB — BIRTH TISSUE RECOVERY COLLECTION (PLACENTA DONATION)

## 2019-11-30 LAB — GLUCOSE, CAPILLARY
Glucose-Capillary: 104 mg/dL — ABNORMAL HIGH (ref 70–99)
Glucose-Capillary: 79 mg/dL (ref 70–99)

## 2019-11-30 MED ORDER — IBUPROFEN 600 MG PO TABS
600.0000 mg | ORAL_TABLET | Freq: Four times a day (QID) | ORAL | Status: DC
  Administered 2019-11-30 – 2019-12-01 (×4): 600 mg via ORAL
  Filled 2019-11-30 (×4): qty 1

## 2019-11-30 NOTE — Progress Notes (Addendum)
POSTPARTUM PROGRESS NOTE  POD #1  Subjective:  Natalie Snow is a 34 y.o. [id number] s/p rLTCS at [redacted]w[redacted]d.  She reports she doing well. No acute events overnight. She denies any problems with ambulating, voiding or po intake. Denies nausea or vomiting. She has passed flatus. Pain is well controlled.  Lochia is mild.  Objective: Blood pressure 102/70, pulse 62, temperature 98.4 F (36.9 C), temperature source Oral, resp. rate 18, height 5\' 6"  (1.676 m), weight 84.8 kg, last menstrual period 03/01/2019, SpO2 100 %, unknown if currently breastfeeding.  Physical Exam:  General: alert, cooperative and no distress Chest: no respiratory distress Abdomen: soft, nontender Uterine Fundus: firm, appropriately tender DVT Evaluation: No calf swelling or tenderness Extremities: no edema Skin: warm, dry; incision clean and intact w/ honeycomb dressing in place, some blood saturation present on left lateral dressing   Recent Labs    11/29/19 1930 11/30/19 0531  HGB 11.0* 9.3*  HCT 33.8* 29.4*    Assessment/Plan: Natalie Snow is a 34 y.o. 31 s/p rLTCS at [redacted]w[redacted]d.  POD#1 - Doing welll; pain well controlled.   Routine postpartum care  OOB, ambulated  Lovenox for VTE prophylaxis  Will place order to change honeycomb dressing  Anemia: asymptomatic  Start po ferrous sulfate daily   Contraception: s/p BTL on 9/13 Feeding: breast and formula   Dispo: Plan for discharge on 9/16 or tomorrow if continuing to do well.   LOS: 1 day   10/16, DO  Family Medicine PGY-3  11/30/2019, 8:08 AM  Attestation of Supervision of Student:  I confirm that I have verified the information documented in the  resident's  note and that I have also personally reperformed the history, physical exam and all medical decision making activities.  I have verified that all services and findings are accurately documented in this student's note; and I agree with management and plan as outlined in the  documentation. I have also made any necessary editorial changes.  12/02/2019, MD Center for [facility], [facility] 11/30/2019 6:51 PM

## 2019-11-30 NOTE — Lactation Note (Signed)
This note was copied from a baby's chart. Lactation Consultation Note  Patient Name: Natalie Snow MOLMB'E Date: 11/30/2019   [name] Natalie Snow now 72 hours old.  Via interpreter Nettie Elm mom reports he is breastfeeding well.  Mom reports she has started formula because she only has drops of colostrum.  Urged to feed on cue and at least 8-12 times day.  Encouraged parents to hand express and/or pump those drops past breastfeeding to help top him off/up as an Brewing technologist to make sure getting enough instead of formula.  Mom reports she needs to get in the shower and denies need for assist at this time.  Urged to call lactation as needed.  Maternal Data    Feeding Feeding Type: Breast Fed  LATCH Score Latch: Grasps breast easily, tongue down, lips flanged, rhythmical sucking.  Audible Swallowing: A few with stimulation  Type of Nipple: Everted at rest and after stimulation  Comfort (Breast/Nipple): Soft / non-tender  Hold (Positioning): No assistance needed to correctly position infant at breast.  [employer] Score: 9  Interventions    Lactation Tools Discussed/Used     Consult Status      Franchot Pollitt Michaelle Copas 11/30/2019, 7:07 PM

## 2019-12-01 LAB — SURGICAL PATHOLOGY

## 2019-12-01 MED ORDER — OXYCODONE HCL 5 MG PO TABS: 5.0000 mg | ORAL_TABLET | ORAL | 0 refills | Status: DC | PRN

## 2019-12-01 MED ORDER — FERROUS SULFATE 324 (65 FE) MG PO TBEC: 1.0000 | DELAYED_RELEASE_TABLET | ORAL | 0 refills | Status: DC

## 2019-12-01 MED ORDER — SENNOSIDES-DOCUSATE SODIUM 8.6-50 MG PO TABS: 2.0000 | ORAL_TABLET | ORAL | 0 refills | Status: DC

## 2019-12-01 MED ORDER — IBUPROFEN 600 MG PO TABS: 600.0000 mg | ORAL_TABLET | Freq: Four times a day (QID) | ORAL | 0 refills | Status: AC

## 2019-12-01 NOTE — Discharge Instructions (Signed)

## 2019-12-03 DIAGNOSIS — Z00110 Health examination for newborn under 8 days old: Secondary | ICD-10-CM | POA: Diagnosis not present

## 2019-12-03 DIAGNOSIS — L530 Toxic erythema: Secondary | ICD-10-CM | POA: Diagnosis not present

## 2019-12-06 ENCOUNTER — Other Ambulatory Visit: Payer: Self-pay

## 2019-12-06 ENCOUNTER — Ambulatory Visit (INDEPENDENT_AMBULATORY_CARE_PROVIDER_SITE_OTHER): Payer: BLUE CROSS/BLUE SHIELD | Admitting: *Deleted

## 2019-12-06 ENCOUNTER — Encounter: Payer: Self-pay | Admitting: *Deleted

## 2019-12-06 VITALS — BP 130/61 | HR 84 | Ht 66.0 in | Wt 176.0 lb

## 2019-12-06 DIAGNOSIS — Z4889 Encounter for other specified surgical aftercare: Secondary | ICD-10-CM

## 2019-12-06 NOTE — Progress Notes (Signed)
Pt presented earlier today for incision check s/p C/S on 11/29/19. Interpreter Donata Clay was present for the encounter. Pt stated her husband removed the steri-strips last night because they were instructed to do so by the doctor @ the hospital.  I assessed the incision and found it to be well-healed and without redness, swelling or drainage. Portions of the skin edge were not fully approximated. Dr. Alysia Penna examined incision. Steri-strips were applied per his recommendation. Proper cleansing of the wound was explained. She was advised that she may remove any remaining steri-strips in one week. Pt voiced understanding of all information and instructions given.

## 2019-12-07 NOTE — Progress Notes (Signed)
Agree with A & P. 

## 2019-12-21 DIAGNOSIS — H04551 Acquired stenosis of right nasolacrimal duct: Secondary | ICD-10-CM | POA: Diagnosis not present

## 2019-12-24 ENCOUNTER — Other Ambulatory Visit: Payer: Self-pay | Admitting: General Practice

## 2019-12-24 DIAGNOSIS — O24410 Gestational diabetes mellitus in pregnancy, diet controlled: Secondary | ICD-10-CM

## 2019-12-27 ENCOUNTER — Ambulatory Visit (INDEPENDENT_AMBULATORY_CARE_PROVIDER_SITE_OTHER): Payer: BLUE CROSS/BLUE SHIELD | Admitting: Obstetrics & Gynecology

## 2019-12-27 ENCOUNTER — Other Ambulatory Visit: Payer: Self-pay

## 2019-12-27 ENCOUNTER — Other Ambulatory Visit: Payer: BLUE CROSS/BLUE SHIELD

## 2019-12-27 ENCOUNTER — Encounter: Payer: Self-pay | Admitting: Obstetrics & Gynecology

## 2019-12-27 VITALS — BP 118/85 | HR 87 | Ht 66.0 in | Wt 173.4 lb

## 2019-12-27 DIAGNOSIS — O24410 Gestational diabetes mellitus in pregnancy, diet controlled: Secondary | ICD-10-CM

## 2019-12-27 DIAGNOSIS — Z98891 History of uterine scar from previous surgery: Secondary | ICD-10-CM

## 2019-12-27 NOTE — Patient Instructions (Signed)
Parto por cesrea, cuidados posteriores Cesarean Delivery, Care After Esta hoja le brinda informacin sobre cmo cuidarse despus del procedimiento. El mdico tambin podr darle indicaciones ms especficas. Comunquese con el mdico si tiene problemas o preguntas. Qu puedo esperar despus del procedimiento? Despus del procedimiento, es comn tener los siguientes sntomas:  Una pequea cantidad de sangre o de lquido transparente que proviene de la incisin.  Enrojecimiento, hinchazn y dolor en la zona de la incisin.  Dolor y molestia abdominales.  Hemorragia vaginal (loquios). Aunque no haya tenido parto vaginal, tendr sangrado y secrecin vaginal.  Calambres plvicos.  Fatiga. Es posible que sienta dolor, hinchazn y molestias en el tejido que se encuentra entre la vagina y el ano (perineo) en los siguientes casos:  Si la cesrea no fue planificada y le permitieron realizar el trabajo de parto y pujar.  Si le hicieron una incisin en la zona (episiotoma) o el tejido se desgarr durante el intento de parto vaginal. Siga estas indicaciones en su casa: Cuidados de la incisin   Siga las indicaciones del mdico acerca del cuidado de la incisin. Asegrese de hacer lo siguiente: ? Lvese las manos con agua y jabn antes de cambiar las vendas (vendaje). Use desinfectante para manos si no dispone de agua y jabn. ? Si tiene un vendaje, cmbielo o quteselo siguiendo las indicaciones del mdico. ? No retire los puntos (suturas), las grapas cutneas, la goma para cerrar la piel o las tiras adhesivas. Es posible que estos cierres cutneos deban permanecer en la piel durante 2semanas o ms tiempo. Si los bordes de las tiras adhesivas empiezan a despegarse y enroscarse, puede recortar los que estn sueltos. No retire las tiras adhesivas por completo a menos que el mdico se lo indique.  Controle todos los das la zona de la incisin para detectar signos de infeccin. Est atento a los  siguientes signos: ? Aumento del enrojecimiento, la hinchazn o el dolor. ? Ms lquido o sangre. ? Calor. ? Pus o mal olor.  No tome baos de inmersin, no nade ni use un jacuzzi hasta que el mdico la autorice. Pregntele al mdico si puede ducharse.  Cuando tosa o estornude, abrace una almohada. Esto ayuda con el dolor y disminuye la posibilidad de que su incisin se abra (dehiscencia). Haga esto hasta que la incisin cicatrice. Medicamentos  Tome los medicamentos de venta libre y los recetados solamente como se lo haya indicado el mdico.  Si le recetaron un antibitico, tmelo como se lo haya indicado el mdico. No deje de tomar el antibitico aunque comience a sentirse mejor.  No conduzca ni use maquinaria pesada mientras toma analgsicos recetados. Estilo de vida  No beba alcohol. Esto es de suma importancia si est amamantando o toma analgsicos.  No consuma ningn producto que contenga nicotina o tabaco, como cigarrillos, cigarrillos electrnicos y tabaco de mascar. Si necesita ayuda para dejar de fumar, consulte al mdico. Comida y bebida  Beba al menos 8vasos de ochoonzas (240cc) de agua todos los das a menos que el mdico le indique lo contrario. Si amamanta, quiz deba beber an ms cantidad de agua.  Coma alimentos ricos en fibras todos los das. Estos alimentos pueden ayudar a prevenir o aliviar el estreimiento. Los alimentos ricos en fibra incluyen los siguientes: ? Panes y cereales integrales. ? Arroz integral. ? Frijoles. ? Frutas y verduras frescas. Actividad   Si es posible, pdale a alguien que la ayude a cuidar del beb y con las tareas del hogar durante al   menos algunos das despus de que le den el alta del hospital.  Retome sus actividades normales como se lo haya indicado el mdico. Pregntele al mdico qu actividades son seguras para usted.  Descanse todo lo que pueda. Trate de descansar o tomar una siesta mientras el beb duerme.  No levante  ningn objeto que pese ms de 10libras (4,5kg) o el lmite de peso que le hayan indicado, hasta que el mdico le diga que puede hacerlo.  Hable con el mdico sobre cundo puede retomar la actividad sexual. Esto puede depender de lo siguiente: ? Riesgo de sufrir una infeccin. ? La rapidez con que cicatrice. ? Comodidad y deseo de retomar la actividad sexual. Indicaciones generales  No use tampones ni se haga duchas vaginales hasta que el mdico la autorice.  Use ropa floja y cmoda y un sostn firme y que le calce bien.  Mantenga el perineo limpio y seco. Cuando vaya al bao, siempre higiencese de adelante hacia atrs.  Si expulsa un cogulo de sangre, gurdelo y llame al mdico para informrselo. No deseche los cogulos de sangre por el inodoro antes de recibir indicaciones del mdico.  Concurra a todas las visitas de seguimiento para usted y el beb, como se lo haya indicado el mdico. Esto es importante. Comunquese con un mdico si:  Tiene los siguientes sntomas: ? Fiebre. ? Secrecin vaginal con mal olor. ? Pus o mal olor en el lugar de la incisin. ? Dificultad o dolor al orinar. ? Aumento o disminucin repentinos de la frecuencia de las deposiciones. ? Aumento del enrojecimiento, la hinchazn o el dolor alrededor de la incisin. ? Aumento del lquido o sangre proveniente de la incisin. ? Erupcin cutnea. ? Nuseas. ? Poco inters o falta de inters en actividades que solan gustarle. ? Dudas sobre su cuidado y el del beb.  Su incisin se siente caliente al tacto.  Siente dolor en las mamas y se ponen rojas o duras.  Siente tristeza o preocupacin de forma inusual.  Vomita.  Elimina un cogulo de sangre grande por la vagina.  Orina ms de lo habitual.  Se siente mareado o aturdido. Solicite ayuda inmediatamente si:  Tiene los siguientes sntomas: ? Dolor que no desaparece o no mejora con medicamentos. ? Dolor en el pecho. ? Dificultad para  respirar. ? Visin borrosa o manchas en la visin. ? Pensamientos de autolesionarse o lesionar al beb. ? Nuevo dolor en el abdomen o en una de las piernas. ? Dolor de cabeza intenso.  Se desmaya.  Tiene una hemorragia tan intensa en la vagina que empapa ms de un apsito en una hora. El sangrado no debe ser ms abundante que el perodo ms intenso que haya tenido. Resumen  Despus del procedimiento, es comn tener dolor en el lugar de la incisin, clicos abdominales, y sangrado vaginal leve.  Controle todos los das la zona de la incisin para detectar signos de infeccin.  Informe al mdico sobre cualquier sntoma inusual.  Concurra a todas las visitas de seguimiento para usted y el beb, como se lo haya indicado el mdico. Esta informacin no tiene como fin reemplazar el consejo del mdico. Asegrese de hacerle al mdico cualquier pregunta que tenga. Document Revised: 10/15/2017 Document Reviewed: 10/15/2017 Elsevier Patient Education  2020 [employer]  

## 2019-12-27 NOTE — Progress Notes (Signed)
° ° °  Post Partum Visit Note  Natalie Snow is a 34 y.o. [phone] female who presents for a postpartum visit. She is 4 weeks postpartum following a repeat cesarean section.  I have fully reviewed the prenatal and intrapartum course. The delivery was at 39  gestational weeks.  Anesthesia: spinal. Postpartum course has been good. Baby is doing well. Baby is feeding by breast. Bleeding no bleeding. Bowel function is normal. Bladder function is normal. Patient is not sexually active. Contraception method is tubal ligation. Postpartum depression screening: negative.   The pregnancy intention screening data noted above was reviewed. Potential methods of contraception were discussed. The patient elected to proceed with Female Sterilization.      The following portions of the patient's history were reviewed and updated as appropriate: allergies, current medications, past family history, past medical history, past social history, past surgical history and problem list.  Review of Systems Pertinent items are noted in HPI.    Objective:  unknown if currently breastfeeding.  General:  alert, cooperative and no distress           Abdomen: soft, non-tender; bowel sounds normal; no masses,  no organomegaly and incision healing well   Vulva:  not evaluated  Vagina: not evaluated                    Assessment:    normal postpartum exam. Pap smear not done at today's visit.   Plan:   Essential components of care per ACOG recommendations:  1.  Mood and well being: Patient with negative depression screening today. Reviewed local resources for support.  - Patient does not use tobacco. - hx of drug use? No  2. Infant care and feeding:  -Patient currently breastmilk feeding? Yes If breastmilk feeding discussed return to work and pumping. If needed, patient was provided letter for work to allow for every 2-3 hr pumping breaks, and to be granted a private location to express breastmilk and  refrigerated area to store breastmilk. Reviewed importance of draining breast regularly to support lactation. -Social determinants of health (SDOH) reviewed in EPIC. No concerns  3. Sexuality, contraception and birth spacing - Patient does not want a pregnancy in the next year.  Desired family size is 3 children.  - Reviewed forms of contraception in tiered fashion. Patient desired bilateral tubal ligation today.    4. Sleep and fatigue -Encouraged family/partner/community support of 4 hrs of uninterrupted sleep to help with mood and fatigue  5. Physical Recovery  - Discussed patients delivery - Patient has urinary incontinence? No  - Patient is safe to resume physical and sexual activity  6.  Health Maintenance - Last pap smear done 2020 and was normal with negative HPV.   7. Chronic Disease, 2 hr GTT today - PCP follow up if abnormal  [facility] [employer], [facility]  Adam Phenix, MD

## 2019-12-28 LAB — GLUCOSE TOLERANCE, 2 HOURS
Glucose, 2 hour: 100 mg/dL (ref 65–139)
Glucose, GTT - Fasting: 85 mg/dL (ref 65–99)

## 2020-01-05 DIAGNOSIS — Z23 Encounter for immunization: Secondary | ICD-10-CM | POA: Diagnosis not present

## 2020-01-05 DIAGNOSIS — Z00129 Encounter for routine child health examination without abnormal findings: Secondary | ICD-10-CM | POA: Diagnosis not present

## 2020-01-21 IMAGING — US OBSTETRIC <14 WK ULTRASOUND
1 series · 15 of 28 positions shown · non-contrast
Comparison: 09/15/2018

CLINICAL DATA: Vaginal bleeding.  No audible Doppler heart tones.

EXAM:
OBSTETRIC <14 WK US AND TRANSVAGINAL OB US
TECHNIQUE: Both transabdominal and transvaginal ultrasound examinations were
performed for complete evaluation of the gestation as well as the
maternal uterus, adnexal regions, and pelvic cul-de-sac.
Transvaginal technique was performed to assess early pregnancy.

[Series 1: obstetric <14 wk ultrasound · 39 acquisitions, 15 frames shown]
[im 1/39]
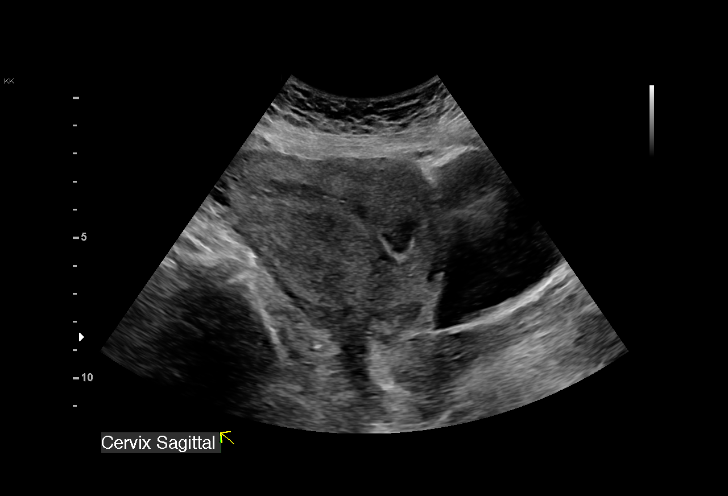
[im 3/39]
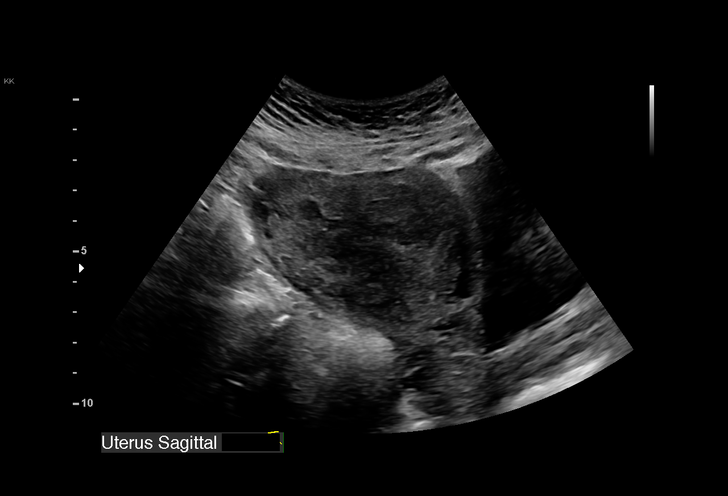
[im 6/39]
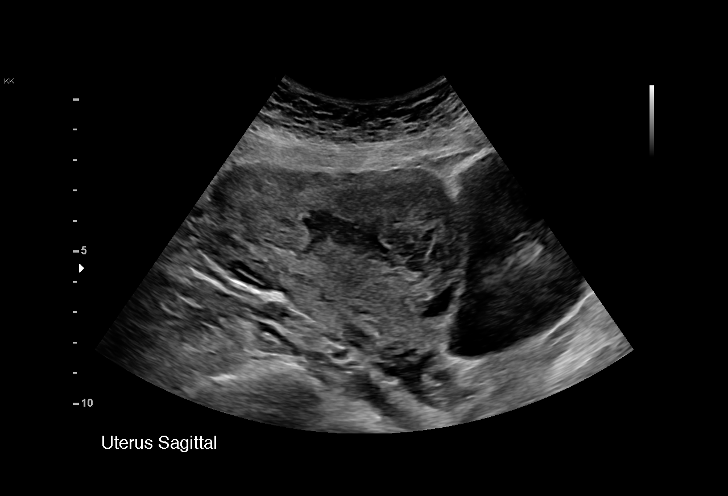
[im 9/39]
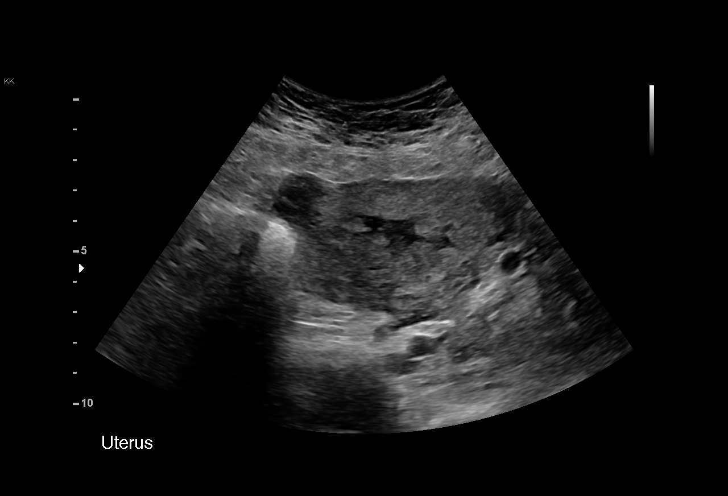
[im 12/39]
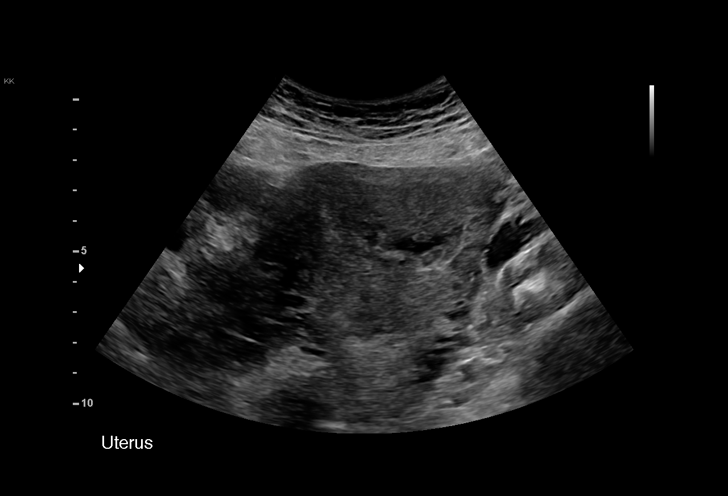
[im 15/39]
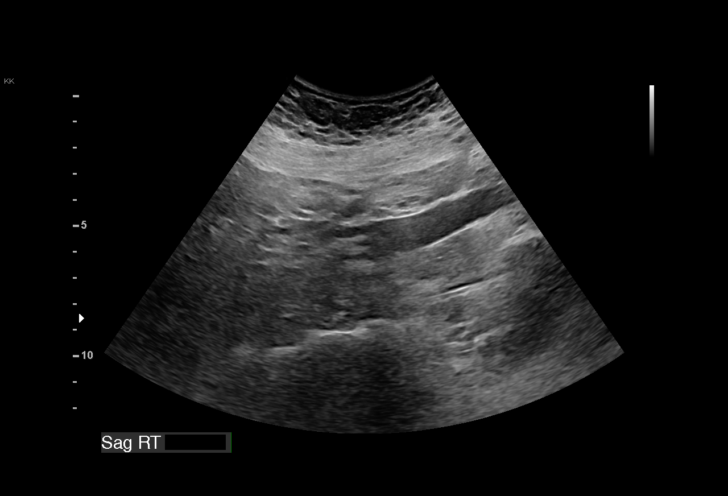
[im 17/39]
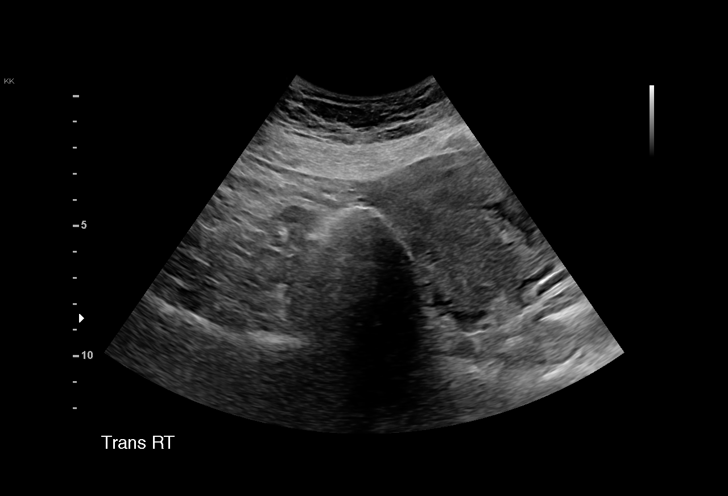
[im 20/39]
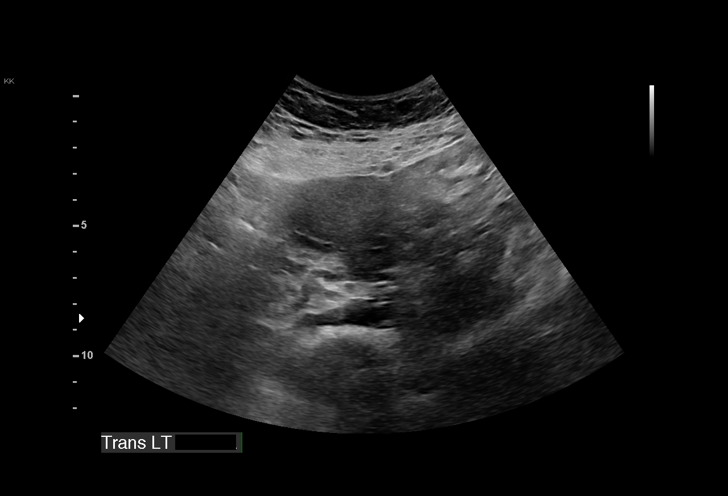
[im 22/39]
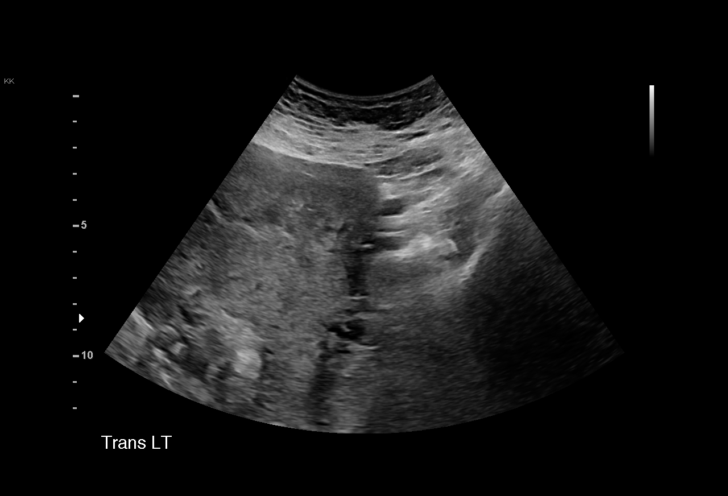
[im 24/39]
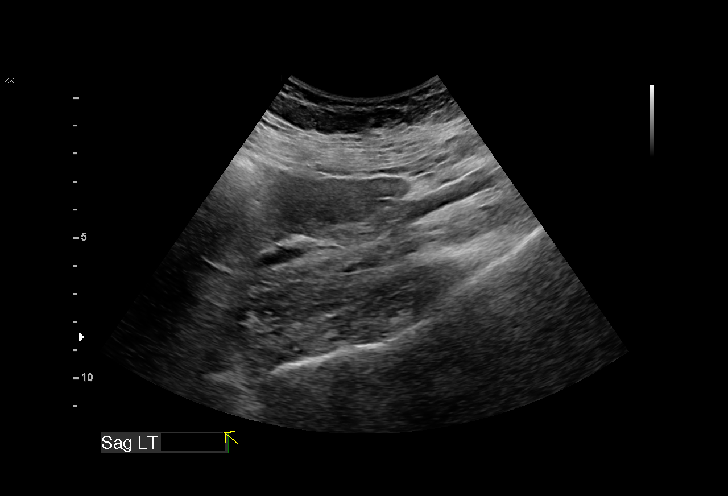
[im 27/39]
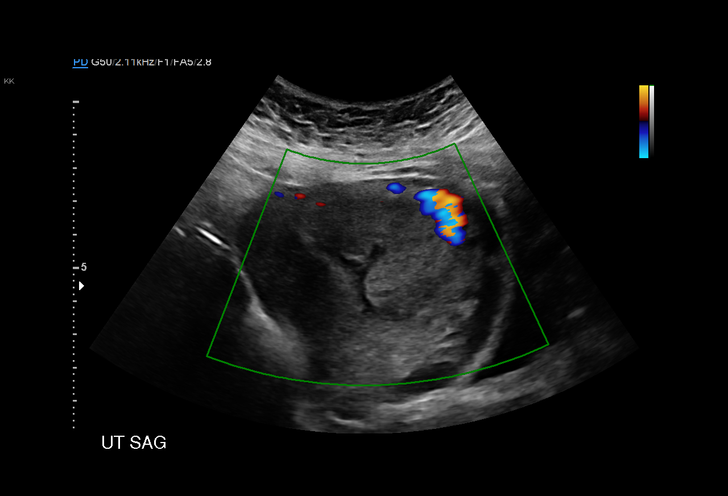
[im 30/39]
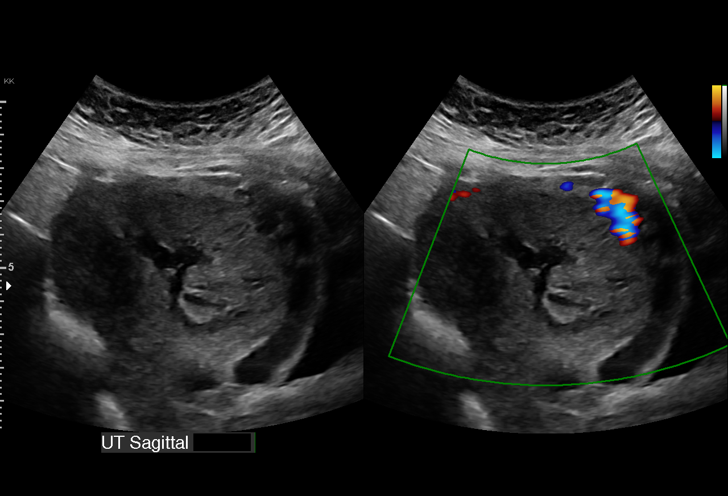
[im 33/39]
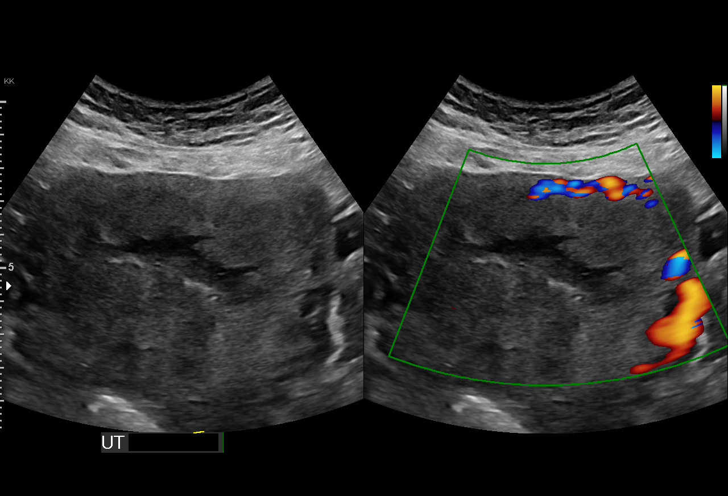
[im 36/39]
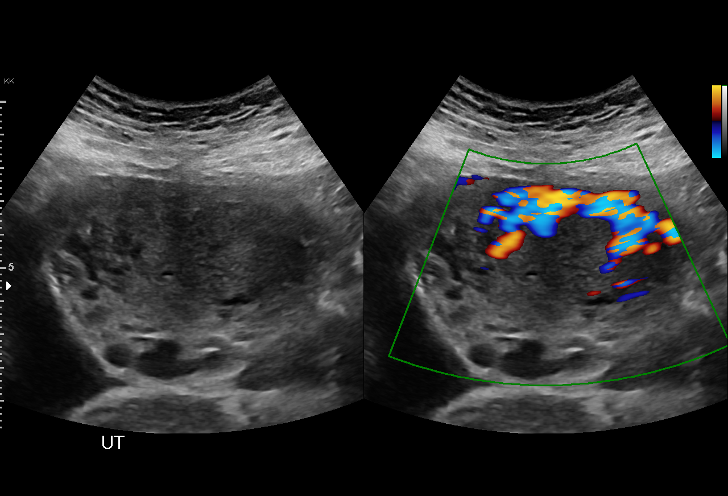
[im 39/39]
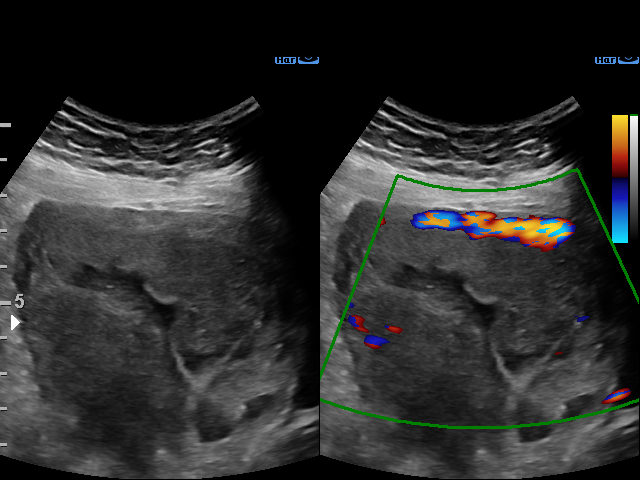

[15 of 28 positions shown; findings below may reference images not displayed]

FINDINGS: Intrauterine gestational sac: None; previously seen IUP is no longer
visualized

Maternal uterus/adnexae: Small amount of fluid noted in endometrial
cavity, but no mass identified. Ovaries not directly visualized on
this study, however no adnexal mass or abnormal free fluid
identified.
IMPRESSION: Previously seen IUP no longer visualized, consistent with interval
spontaneous abortion.

Small amount of fluid in endometrial cavity, but no definite
evidence of retained products of conception.

## 2020-02-02 DIAGNOSIS — L853 Xerosis cutis: Secondary | ICD-10-CM | POA: Diagnosis not present

## 2020-02-02 DIAGNOSIS — Z00121 Encounter for routine child health examination with abnormal findings: Secondary | ICD-10-CM | POA: Diagnosis not present

## 2020-02-02 DIAGNOSIS — Z23 Encounter for immunization: Secondary | ICD-10-CM | POA: Diagnosis not present

## 2020-02-22 ENCOUNTER — Encounter: Payer: Self-pay | Admitting: General Practice

## 2022-04-11 ENCOUNTER — Ambulatory Visit (INDEPENDENT_AMBULATORY_CARE_PROVIDER_SITE_OTHER): Payer: Medicaid Other | Admitting: Obstetrics & Gynecology

## 2022-04-11 ENCOUNTER — Other Ambulatory Visit: Payer: Self-pay

## 2022-04-11 ENCOUNTER — Encounter: Payer: Self-pay | Admitting: Obstetrics & Gynecology

## 2022-04-11 VITALS — BP 112/62 | HR 87 | Wt 179.5 lb

## 2022-04-11 DIAGNOSIS — R300 Dysuria: Secondary | ICD-10-CM | POA: Diagnosis not present

## 2022-04-11 DIAGNOSIS — N938 Other specified abnormal uterine and vaginal bleeding: Secondary | ICD-10-CM | POA: Diagnosis not present

## 2022-04-11 DIAGNOSIS — Z23 Encounter for immunization: Secondary | ICD-10-CM | POA: Diagnosis not present

## 2022-04-11 NOTE — Progress Notes (Signed)
Patient ID: Natalie Snow, female   DOB: 03-12-1986, 37 y.o.   MRN: [id number]  Chief Complaint  Patient presents with   Menstrual Problem    HPI Ivyana Locey is a 37 y.o. female.  Patient has had DUB with prolonged spotting before and after heavier menstrual flow irregularly since she stopped nursing her child about 2 years ago. S/p BTL at last CS HPI  Past Medical History:  Diagnosis Date   Gestational diabetes    Headache    Medical history non-contributory    UTI (urinary tract infection)     Past Surgical History:  Procedure Laterality Date   CESAREAN SECTION     x2   2004, 2010   CESAREAN SECTION WITH BILATERAL TUBAL LIGATION N/A 11/29/2019   Procedure: CESAREAN SECTION WITH BILATERAL TUBAL LIGATION;  Surgeon: Aletha Halim, MD;  Location: [facility];  Service: Obstetrics;  Laterality: N/A;    Family History  Problem Relation Age of Onset   Thyroid disease Mother    Thyroid disease Sister     Social History Social History   Tobacco Use   Smoking status: Never   Smokeless tobacco: Never  Vaping Use   Vaping Use: Never used  Substance Use Topics   Alcohol use: Not Currently    Comment: occasionally   Drug use: Never    No Known Allergies  Current Outpatient Medications  Medication Sig Dispense Refill   ferrous sulfate 324 (65 Fe) MG TBEC Take 1 tablet (325 mg total) by mouth every other day. 90 tablet 0   ibuprofen (ADVIL) 600 MG tablet Take 1 tablet (600 mg total) by mouth every 6 (six) hours. 30 tablet 0   omeprazole (PRILOSEC) 20 MG capsule Take 1 capsule (20 mg total) by mouth 2 (two) times daily before a meal. 60 capsule 6   Accu-Chek Softclix Lancets lancets Use as instructed; please check blood glucose 4 times daily (Patient not taking: Reported on 12/06/2019) 100 each 12   glucose blood (ACCU-CHEK GUIDE) test strip Use as instructed; please check blood glucose 4 times daily (Patient not taking: Reported on 12/06/2019) 100 each 12    Magnesium Oxide 200 MG TABS Take 1 tablet (200 mg total) by mouth daily. (Patient not taking: Reported on 11/25/2019) 90 tablet 1   oxyCODONE (OXY IR/ROXICODONE) 5 MG immediate release tablet Take 1-2 tablets (5-10 mg total) by mouth every 4 (four) hours as needed for moderate pain. (Patient not taking: Reported on 12/06/2019) 30 tablet 0   Prenatal Vit-Fe Fumarate-FA (MULTIVITAMIN-PRENATAL) 27-0.8 MG TABS tablet Take 1 tablet by mouth daily at 12 noon. (Patient not taking: Reported on 04/11/2022) 30 tablet 0   senna-docusate (SENOKOT-S) 8.6-50 MG tablet Take 2 tablets by mouth daily. (Patient not taking: Reported on 12/06/2019) 20 tablet 0   No current facility-administered medications for this visit.    Review of Systems Review of Systems  Genitourinary:  Positive for menstrual problem. Negative for pelvic pain.    Blood pressure 112/62, pulse 87, weight 179 lb 8 oz (81.4 kg), last menstrual period 02/28/2022, unknown if currently breastfeeding.  Physical Exam Physical Exam Vitals and nursing note reviewed.  HENT:     Head: Normocephalic and atraumatic.  Cardiovascular:     Rate and Rhythm: Normal rate.  Pulmonary:     Effort: Pulmonary effort is normal.  Neurological:     Mental Status: She is alert.  Psychiatric:        Mood and Affect: Mood normal.  Behavior: Behavior normal.     Data Reviewed Pap normal 09/2018  Assessment DUB s/p BTL  Plan  Orders Placed This Encounter  Procedures   Urine Culture   US PELVIC COMPLETE WITH TRANSVAGINAL    Standing Status:   Future    Standing Expiration Date:   04/12/2023    Order Specific Question:   Reason for Exam (SYMPTOM  OR DIAGNOSIS REQUIRED)    Answer:   DUB    Order Specific Question:   Preferred imaging location?    Answer:   [facility] Ul[facility]ound   Flu Vaccine QUAD 97mo+IM (Fluarix, Fluzone & Alfiuria Quad PF)   RTC after imaging    Emeterio Reeve 04/11/2022, 11:06 AM

## 2022-04-14 LAB — URINE CULTURE

## 2022-04-15 LAB — POCT URINALYSIS DIP (DEVICE)
Bilirubin Urine: NEGATIVE
Glucose, UA: NEGATIVE mg/dL
Ketones, ur: NEGATIVE mg/dL
Nitrite: NEGATIVE
Protein, ur: NEGATIVE mg/dL
Specific Gravity, Urine: 1.025 (ref 1.005–1.030)
Urobilinogen, UA: 0.2 mg/dL (ref 0.0–1.0)
pH: 6 (ref 5.0–8.0)

## 2022-04-16 ENCOUNTER — Telehealth: Payer: Self-pay | Admitting: Family Medicine

## 2022-04-16 NOTE — Telephone Encounter (Signed)
Want to discuss her test results, said she saw she may have a infection

## 2022-04-17 MED ORDER — CEFADROXIL 500 MG PO CAPS: 500.0000 mg | ORAL_CAPSULE | Freq: Two times a day (BID) | ORAL | 0 refills | Status: DC

## 2022-04-17 NOTE — Telephone Encounter (Signed)
Called patient with Natalie Snow assisting with spanish interpreter stating I am returning her phone call. Patient states she is still having symptoms and wants to know if she has an infection. Told patient she does & reviewed antibiotic instructions sent to pharmacy. Patient verbalized understanding.

## 2022-04-30 ENCOUNTER — Ambulatory Visit
Admission: RE | Admit: 2022-04-30 | Discharge: 2022-04-30 | Disposition: A | Discharge status: Home / Self Care | Payer: Medicaid Other | Source: Ambulatory Visit | Attending: Obstetrics & Gynecology | Admitting: Obstetrics & Gynecology

## 2022-04-30 DIAGNOSIS — N938 Other specified abnormal uterine and vaginal bleeding: Secondary | ICD-10-CM | POA: Diagnosis present

## 2022-06-05 ENCOUNTER — Ambulatory Visit: Payer: BLUE CROSS/BLUE SHIELD | Admitting: Obstetrics & Gynecology

## 2022-07-01 ENCOUNTER — Other Ambulatory Visit: Payer: Self-pay

## 2022-07-01 ENCOUNTER — Encounter: Payer: Self-pay | Admitting: Obstetrics & Gynecology

## 2022-07-01 ENCOUNTER — Ambulatory Visit (INDEPENDENT_AMBULATORY_CARE_PROVIDER_SITE_OTHER): Payer: Medicaid Other | Admitting: Obstetrics & Gynecology

## 2022-07-01 VITALS — BP 132/82 | HR 93 | Wt 178.0 lb

## 2022-07-01 DIAGNOSIS — N938 Other specified abnormal uterine and vaginal bleeding: Secondary | ICD-10-CM

## 2022-07-01 NOTE — Progress Notes (Signed)
Patient ID: Natalie Snow, female   DOB: 01-Jan-1986, 37 y.o.   MRN: [id number]  Chief Complaint  Patient presents with   Follow-up    HPI Natalie Snow is a 37 y.o. female.  [id number] Patient's last menstrual period was 06/24/2022 (exact date). Patient comes for f/u after pelvic US was done to evaluate menorrhagia. US showed a tiny endometrial lesion 5mm possible polyp. CT was ordered by her PCP that demonstrated possible vaginal cyst or urethral diverticulum HPI  Past Medical History:  Diagnosis Date   Gestational diabetes    Headache    Medical history non-contributory    UTI (urinary tract infection)     Past Surgical History:  Procedure Laterality Date   CESAREAN SECTION     x2   2004, 2010   CESAREAN SECTION WITH BILATERAL TUBAL LIGATION N/A 11/29/2019   Procedure: CESAREAN SECTION WITH BILATERAL TUBAL LIGATION;  Surgeon: [place], MD;  Location: [facility];  Service: Obstetrics;  Laterality: N/A;    Family History  Problem Relation Age of Onset   Thyroid disease Mother    Thyroid disease Sister     Social History Social History   Tobacco Use   Smoking status: Never   Smokeless tobacco: Never  Vaping Use   Vaping Use: Never used  Substance Use Topics   Alcohol use: Not Currently    Comment: occasionally   Drug use: Never    No Known Allergies  Current Outpatient Medications  Medication Sig Dispense Refill   ibuprofen (ADVIL) 600 MG tablet Take 1 tablet (600 mg total) by mouth every 6 (six) hours. 30 tablet 0   SUMAtriptan (IMITREX) 100 MG tablet Take by mouth.     No current facility-administered medications for this visit.    Review of Systems Review of Systems  Constitutional: Negative.   Genitourinary:  Positive for menstrual problem. Negative for dysuria, pelvic pain, urgency and vaginal bleeding.    Blood pressure 132/82, pulse 93, weight 178 lb (80.7 kg), last menstrual period 06/24/2022, not currently  breastfeeding.  Physical Exam Physical Exam Vitals and nursing note reviewed. Exam conducted with a chaperone present.  Constitutional:      Appearance: Normal appearance.  HENT:     Head: Normocephalic and atraumatic.  Pulmonary:     Effort: Pulmonary effort is normal.  Genitourinary:    General: Normal vulva.     Vagina: No vaginal discharge.  Skin:    General: Skin is warm.  Neurological:     Mental Status: She is alert.  Psychiatric:        Mood and Affect: Mood normal.        Behavior: Behavior normal.     Data Reviewed Narrative & Impression  CLINICAL DATA:  Dysfunctional uterine bleeding   EXAM: TRANSABDOMINAL AND TRANSVAGINAL ULTRASOUND OF PELVIS   TECHNIQUE: Both transabdominal and transvaginal ultrasound examinations of the pelvis were performed. Transabdominal technique was performed for global imaging of the pelvis including uterus, ovaries, adnexal regions, and pelvic cul-de-sac. It was necessary to proceed with endovaginal exam following the transabdominal exam to visualize the endometrium.   COMPARISON:  None Available.   FINDINGS: Uterus   Measurements: 7.2 x 3.7 x 5.4 cm = volume: 74.9 mL. No fibroids or other mass visualized.   Endometrium   Thickness: 5 mm. Small amount of fluid in the endometrial cavity. C-section scar noted. Additionally within the lower uterine segment there is a 5 mm echogenic mass (image 25; cine series)   Right  ovary   Measurements: 2.9 x 2.1 x 2.1 cm = volume: 6.4 mL. Normal appearance/no adnexal mass.   Left ovary   Measurements: 2.8 x 1.8 x 2.3 cm = volume: 6.2 mL. Normal appearance/no adnexal mass.   Other findings   No abnormal free fluid.   IMPRESSION: 1. Within the lower uterine segment demonstrated best on the cine series there is a 5 mm echogenic mass. This may represent an endometrial polyp. Consider further evaluation with sonohysterogram for confirmation prior to hysteroscopy. Endometrial  sampling should also be considered if patient is at high risk for endometrial carcinoma. (Ref: Radiological Reasoning: Algorithmic Workup of Abnormal Vaginal Bleeding with Endovaginal Sonography and Sonohysterography. AJR 2008; 161:W96-04)     Electronically Signed   By: Annia Belt M.D.   On: 04/30/2022 10:22   CT Abdomen Pelvis W Contrast  Anatomical Region Laterality Modality  Abdomen -- Computed Tomography  Pelvis -- --   Impression  1. No acute abnormality in the abdomen or pelvis. 2. Subtle hypodense 11 mm focus in the anterior vaginal wall along the urethra is nonspecific but may reflect a Bartholin's gland cyst or urethral diverticulum. Consider further evaluation with direct visualization and consider further characterization by pelvic MRI with and without contrast..   Electronically Signed   By: Maudry Mayhew M.D.   On: 05/31/2022 10:56 Narrative  CLINICAL DATA:  Lower back pain, UTIs, hematuria, abdominal pain. Prior tubal ligation and C-section.  EXAM: CT ABDOMEN AND PELVIS WITH CONTRAST  TECHNIQUE: Multidetector CT imaging of the abdomen and pelvis was performed using the standard protocol following bolus administration of intravenous contrast.  RADIATION DOSE REDUCTION: This exam was performed according to the departmental dose-optimization program which includes automated exposure control, adjustment of the mA and/or kV according to patient size and/or use of iterative reconstruction technique.  CONTRAST:  85 mL iohexoL (OMNIPAQUE) 350 mg iodine/mL injection (MDV) 85 mL  COMPARISON:  Pelvic ultrasound April 30, 2022  FINDINGS: Lower chest: No acute abnormality.  Hepatobiliary: No suspicious hepatic lesion. Gallbladder is unremarkable. No biliary ductal dilation.  Pancreas: No pancreatic ductal dilation or evidence of acute inflammation.  Spleen: No splenomegaly.  Adrenals/Urinary Tract: Bilateral adrenal glands appear normal.  No hydronephrosis. Kidneys demonstrate symmetric enhancement. Urinary bladder is unremarkable for degree of distension.  Stomach/Bowel: Radiopaque enteric contrast material traverses the descending colon. Stomach is unremarkable for degree of distension. No pathologic dilation of small or large bowel. Normal appendix. No evidence of acute bowel inflammation.  Vascular/Lymphatic: Normal caliber abdominal aorta. Smooth IVC contours. The portal, splenic and superior mesenteric veins are patent. No pathologically enlarged abdominal or pelvic lymph nodes.  Reproductive: Uterus and bilateral adnexa are unremarkable. Subtle hypodense 11 mm focus in the anterior vaginal wall along the urethra on image 88/2.  Other: No significant abdominopelvic free fluid.  Musculoskeletal: No acute osseous abnormality. Procedure Note  Caroline Sauger, MD - 05/31/2022 Formatting of this note might be different from the original. CLINICAL DATA:  Lower back pain, UTIs, hematuria, abdominal pain. Prior tubal ligation and C-section.  EXAM: CT ABDOMEN AND PELVIS WITH CONTRAST  TECHNIQUE: Multidetector CT imaging of the abdomen and pelvis was performed using the standard protocol following bolus administration of intravenous contrast.  RADIATION DOSE REDUCTION: This exam was performed according to the departmental dose-optimization program which includes automated exposure control, adjustment of the mA and/or kV according to patient size and/or use of iterative reconstruction technique.  CONTRAST:  85 mL iohexoL (OMNIPAQUE) 350 mg iodine/mL injection (MDV)  85 mL  COMPARISON:  Pelvic ultrasound April 30, 2022  FINDINGS: Lower chest: No acute abnormality.  Hepatobiliary: No suspicious hepatic lesion. Gallbladder is unremarkable. No biliary ductal dilation.  Pancreas: No pancreatic ductal dilation or evidence of acute inflammation.  Spleen: No splenomegaly.  Adrenals/Urinary Tract:  Bilateral adrenal glands appear normal. No hydronephrosis. Kidneys demonstrate symmetric enhancement. Urinary bladder is unremarkable for degree of distension.  Stomach/Bowel: Radiopaque enteric contrast material traverses the descending colon. Stomach is unremarkable for degree of distension. No pathologic dilation of small or large bowel. Normal appendix. No evidence of acute bowel inflammation.  Vascular/Lymphatic: Normal caliber abdominal aorta. Smooth IVC contours. The portal, splenic and superior mesenteric veins are patent. No pathologically enlarged abdominal or pelvic lymph nodes.  Reproductive: Uterus and bilateral adnexa are unremarkable. Subtle hypodense 11 mm focus in the anterior vaginal wall along the urethra on image 88/2.  Other: No significant abdominopelvic free fluid.  Musculoskeletal: No acute osseous abnormality.  IMPRESSION: 1. No acute abnormality in the abdomen or pelvis. 2. Subtle hypodense 11 mm focus in the anterior vaginal wall along the urethra is nonspecific but may reflect a Bartholin's gland cyst or urethral diverticulum. Consider further evaluation with direct visualization and consider further characterization by pelvic MRI with and without contrast..   Electronically Signed   By: Maudry Mayhew M.D.   On: 05/31/2022 10:56 Exam End: 05/31/22 08:44   Specimen Collected: 05/31/22 10:48 Last Resulted: 05/31/22 10:56  Received From: [facility]  Result   Assessment Her menses are manageable according to her and will consider IUD or other measures if her sx worsen Tiny endometrial lesion, will not try to sample at this time Plan RTC if her sx worsen    Scheryl Darter 07/01/2022, 12:07 PM
